# Patient Record
Sex: Female | Born: 1945 | Race: White | Hispanic: No | Marital: Married | Smoking: Former smoker
Health system: Southern US, Community
[De-identification: ages and names within clinical notes are randomized; demographics above are authoritative.]

## PROBLEM LIST (undated history)

## (undated) DIAGNOSIS — E785 Hyperlipidemia, unspecified: Secondary | ICD-10-CM

## (undated) DIAGNOSIS — M8580 Other specified disorders of bone density and structure, unspecified site: Secondary | ICD-10-CM

## (undated) DIAGNOSIS — I10 Essential (primary) hypertension: Secondary | ICD-10-CM

## (undated) DIAGNOSIS — M26609 Unspecified temporomandibular joint disorder, unspecified side: Secondary | ICD-10-CM

## (undated) HISTORY — PX: OVARIAN CYST REMOVAL: SHX89

## (undated) HISTORY — DX: Hyperlipidemia, unspecified: E78.5

## (undated) HISTORY — DX: Other specified disorders of bone density and structure, unspecified site: M85.80

## (undated) HISTORY — PX: ABDOMINAL HYSTERECTOMY: SHX81

## (undated) HISTORY — DX: Essential (primary) hypertension: I10

## (undated) HISTORY — PX: PILONIDAL CYST EXCISION: SHX744

## (undated) HISTORY — DX: Unspecified temporomandibular joint disorder, unspecified side: M26.609

---

## 1998-01-26 ENCOUNTER — Other Ambulatory Visit: Admission: RE | Admit: 1998-01-26 | Discharge: 1998-01-26 | Payer: Self-pay | Admitting: Obstetrics and Gynecology

## 1999-08-27 ENCOUNTER — Other Ambulatory Visit: Admission: RE | Admit: 1999-08-27 | Discharge: 1999-08-27 | Payer: Self-pay | Admitting: Obstetrics and Gynecology

## 2001-11-23 ENCOUNTER — Other Ambulatory Visit: Admission: RE | Admit: 2001-11-23 | Discharge: 2001-11-23 | Payer: Self-pay | Admitting: Obstetrics and Gynecology

## 2009-12-25 ENCOUNTER — Encounter: Payer: Self-pay | Admitting: Internal Medicine

## 2010-01-31 ENCOUNTER — Encounter (INDEPENDENT_AMBULATORY_CARE_PROVIDER_SITE_OTHER): Payer: Self-pay | Admitting: *Deleted

## 2010-02-01 ENCOUNTER — Ambulatory Visit: Payer: Self-pay | Admitting: Internal Medicine

## 2010-02-15 ENCOUNTER — Ambulatory Visit: Payer: Self-pay | Admitting: Internal Medicine

## 2010-06-26 NOTE — Miscellaneous (Signed)
Summary: [facility] previsit  Clinical Lists Changes  Medications: Added new medication of DULCOLAX 5 MG  TBEC (BISACODYL) Day before procedure take 2 at 3pm and 2 at 8pm. - Signed Added new medication of METOCLOPRAMIDE HCL 10 MG  TABS (METOCLOPRAMIDE HCL) As per prep instructions. - Signed Added new medication of MIRALAX   POWD (POLYETHYLENE GLYCOL 3350) As per prep  instructions. - Signed Rx of DULCOLAX 5 MG  TBEC (BISACODYL) Day before procedure take 2 at 3pm and 2 at 8pm.;  #4 x 0;  Signed;  Entered by: Karl Bales RN;  Authorized by: Hart Carwin MD;  Method used: Electronically to [facility] [address] #[id number]*, [address] [place], [place]  [address], Ph: [phone], Fax: [phone] Rx of METOCLOPRAMIDE HCL 10 MG  TABS (METOCLOPRAMIDE HCL) As per prep instructions.;  #2 x 0;  Signed;  Entered by: Karl Bales RN;  Authorized by: Hart Carwin MD;  Method used: Electronically to [facility] [address] #[id number]*, [address] [place], [place]  [address], Ph: [phone], Fax: [phone] Rx of MIRALAX   POWD (POLYETHYLENE GLYCOL 3350) As per prep  instructions.;  #255gm x 0;  Signed;  Entered by: Karl Bales RN;  Authorized by: Hart Carwin MD;  Method used: Electronically to [facility] [address] #[id number]*, [address] [place], [place]  [address], Ph: [phone], Fax: [phone] Allergies: Added new allergy or adverse reaction of MACRODANTIN Added new allergy or adverse reaction of AMPICILLIN Added new allergy or adverse reaction of * MINOCIN Added new allergy or adverse reaction of DARVOCET Added new allergy or adverse reaction of * DARVON Added new allergy or adverse reaction of CODEINE Added new allergy or adverse reaction of * OXYCONTIN Added new allergy or adverse reaction of HYDROCODONE Observations: Added new observation of NKA: F (02/01/2010 7:55)    Prescriptions: MIRALAX   POWD (POLYETHYLENE GLYCOL 3350) As per prep  instructions.  #255gm x 0   Entered by:    Karl Bales RN   Authorized by:   Hart Carwin MD   Signed by:   Karl Bales RN on 02/01/2010   Method used:   Electronically to        [facility] [address] #[id number]* (retail)       [address] [place], [place]  [address]       Ph: [phone]       Fax: [phone]   RxID:   [id number] METOCLOPRAMIDE HCL 10 MG  TABS (METOCLOPRAMIDE HCL) As per prep instructions.  #2 x 0   Entered by:   Karl Bales RN   Authorized by:   Hart Carwin MD   Signed by:   Karl Bales RN on 02/01/2010   Method used:   Electronically to        [facility] [address] #[id number]* (retail)       [address] [place], [place]  [address]       Ph: [phone]       Fax: [phone]   RxID:   [id number] DULCOLAX 5 MG  TBEC (BISACODYL) Day before procedure take 2 at 3pm and 2 at 8pm.  #4 x 0   Entered by:   Karl Bales RN   Authorized by:   Hart Carwin MD   Signed by:   Karl Bales RN on 02/01/2010   Method used:   Electronically to  [facility] [address][id number]* (retail)       [address] [place], [place]  [address]       Ph: [phone]       Fax: [phone]   RxID:   [id number]

## 2010-06-26 NOTE — Letter (Signed)
Summary: [facility] Instructions  [facility]  [address] [address] [place], [place] [address]   Phone: [phone]  Fax: [phone]       Cheryl Skinner    August 31, 1945    MRN: [id number]       Procedure Day Dorna Bloom:  Lenor Coffin  02/15/10     Arrival Time:   8:00AM     Procedure Time:  9:00AM     Location of Procedure:                    _ X_   Endoscopy Center (4th Floor)   PREPARATION FOR COLONOSCOPY WITH MIRALAX  Starting 5 days prior to your procedure 02/10/10 do not eat nuts, seeds, popcorn, corn, beans, peas,  salads, or any raw vegetables.  Do not take any fiber supplements (e.g. Metamucil, Citrucel, and Benefiber). ____________________________________________________________________________________________________   THE DAY BEFORE YOUR PROCEDURE         DATE: 02/14/10  DAY: WEDNESDAY  1   Drink clear liquids the entire day-NO SOLID FOOD  2   Do not drink anything colored red or purple.  Avoid juices with pulp.  No orange juice.  3   Drink at least 64 oz. (8 glasses) of fluid/clear liquids during the day to prevent dehydration and help the prep work efficiently.  CLEAR LIQUIDS INCLUDE: Water Jello Ice Popsicles Tea (sugar ok, no milk/cream) Powdered fruit flavored drinks Coffee (sugar ok, no milk/cream) Gatorade Juice: apple, white grape, white cranberry  Lemonade Clear bullion, consomm, broth Carbonated beverages (any kind) Strained chicken noodle soup Hard Candy  4   Mix the entire bottle of Miralax with 64 oz. of Gatorade/Powerade in the morning and put in the refrigerator to chill.  5   At 3:00 pm take 2 Dulcolax/Bisacodyl tablets.  6   At 4:30 pm take one Reglan/Metoclopramide tablet.  7  Starting at 5:00 pm drink one 8 oz glass of the Miralax mixture every 15-20 minutes until you have finished drinking the entire 64 oz.  You should finish drinking prep around 7:30 or 8:00 pm.  8   If you are nauseated, you may take the 2nd Reglan/Metoclopramide  tablet at 6:30 pm.        9    At 8:00 pm take 2 more DULCOLAX/Bisacodyl tablets.     THE DAY OF YOUR PROCEDURE      DATE:  02/15/10  DAY: Lenor Coffin  You may drink clear liquids until 7:00AM  (2 HOURS BEFORE PROCEDURE).   MEDICATION INSTRUCTIONS  Unless otherwise instructed, you should take regular prescription medications with a small sip of water as early as possible the morning of your procedure.   Additional medication instructions: HOLD Losartan/HCTZ before coming in the morning of your procedure (hold 02-15-10 only)         OTHER INSTRUCTIONS  You will need a responsible adult at least 67 years of age to accompany you and drive you home.   This person must remain in the waiting room during your procedure.  Wear loose fitting clothing that is easily removed.  Leave jewelry and other valuables at home.  However, you may wish to bring a book to read or an iPod/MP3 player to listen to music as you wait for your procedure to start.  Remove all body piercing jewelry and leave at home.  Total time from sign-in until discharge is approximately 2-3 hours.  You should go home directly after your procedure and rest.  You can resume normal  activities the day after your procedure.  The day of your procedure you should not:   Drive   Make legal decisions   Operate machinery   Drink alcohol   Return to work  You will receive specific instructions about eating, activities and medications before you leave.   The above instructions have been reviewed and explained to me by   Karl Bales RN  February 01, 2010 8:29 AM    I fully understand and can verbalize these instructions _____________________________ Date _______

## 2010-06-26 NOTE — Procedures (Signed)
Summary: Colonoscopy  Patient: Cheryl Skinner Note: All result statuses are Final unless otherwise noted.  Tests: (1) Colonoscopy (COL)   COL Colonoscopy           DONE     [facility]     [address]     [place], [place]  [address]           COLONOSCOPY PROCEDURE REPORT           PATIENT:  Cheryl, Skinner  MR#:  [id number]     BIRTHDATE:  1946/04/27, 63 yrs. old  GENDER:  female     ENDOSCOPIST:  Hedwig Morton. Juanda Chance, MD     REF. BY:  Frazier Richards, M.D.     PROCEDURE DATE:  02/15/2010     PROCEDURE:  Colonoscopy 19147     ASA CLASS:  Class I     INDICATIONS:  Routine Risk Screening     MEDICATIONS:   Versed 8 mg, Fentanyl 75 mcg           DESCRIPTION OF PROCEDURE:   After the risks benefits and     alternatives of the procedure were thoroughly explained, informed     consent was obtained.  Digital rectal exam was performed and     revealed no rectal masses.   The [id number] endoscope was     introduced through the anus and advanced to the cecum, which was     identified by both the appendix and ileocecal valve, without     limitations.  The quality of the prep was good, using MiraLax.     The instrument was then slowly withdrawn as the colon was fully     examined.     <<PROCEDUREIMAGES>>           FINDINGS:  No polyps or cancers were seen (see image1, image3, and     image4).   Retroflexed views in the rectum revealed no     abnormalities.    The scope was then withdrawn from the patient     and the procedure completed.           COMPLICATIONS:  None     ENDOSCOPIC IMPRESSION:     1) No polyps or cancers     2) Normal colonoscopy     RECOMMENDATIONS:     1) high fiber diet     REPEAT EXAM:  In 10 year(s) for.           ______________________________     Hedwig Morton. Juanda Chance, MD           CC:           n.     eSIGNED:   Hedwig Morton. Brodie at 02/15/2010 09:30 AM           Phoebe Sharps, [id number]  Note: An exclamation mark (!) indicates a result that was not  dispersed into the flowsheet. Document Creation Date: 02/15/2010 9:30 AM _______________________________________________________________________  (1) Order result status: Final Collection or observation date-time: 02/15/2010 09:24 Requested date-time:  Receipt date-time:  Reported date-time:  Referring Physician:   Ordering Physician: Lina Sar [id number]) Specimen Source:  Source: Launa Grill Order Number: [id number] Lab site:   Appended Document: Colonoscopy    Clinical Lists Changes  Observations: Added new observation of COLONNXTDUE: 01/2020 (02/15/2010 14:10)

## 2010-06-26 NOTE — Letter (Signed)
Summary: Previsit letter  [facility]  [address] [place], [place] [address]   Phone: [phone]  Fax: [phone]       12/25/2009 MRN: [id number]  ANGELIE KRAM [address] [place]  [address]  Dear Ms. Broadus John,  Welcome to the Gastroenterology Division at [facility].    You are scheduled to see a nurse for your pre-procedure visit on 02-01-10 at 8am on the 3rd floor at [facility], [address]  We ask that you try to arrive at our office 15 minutes prior to your appointment time to allow for check-in.  Your nurse visit will consist of discussing your medical and surgical history, your immediate family medical history, and your medications.    Please bring a complete list of all your medications or, if you prefer, bring the medication bottles and we will list them.  We will need to be aware of both prescribed and over the counter drugs.  We will need to know exact dosage information as well.  If you are on blood thinners (Coumadin, Plavix, Aggrenox, Ticlid, etc.) please call our office today/prior to your appointment, as we need to consult with your physician about holding your medication.   Please be prepared to read and sign documents such as consent forms, a financial agreement, and acknowledgement forms.  If necessary, and with your consent, a friend or relative is welcome to sit-in on the nurse visit with you.  Please bring your insurance card so that we may make a copy of it.  If your insurance requires a referral to see a specialist, please bring your referral form from your primary care physician.  No co-pay is required for this nurse visit.     If you cannot keep your appointment, please call [phone] to cancel or reschedule prior to your appointment date.  This allows [place] the opportunity to schedule an appointment for another patient in need of care.    Thank you for choosing [facility] for your medical needs.  We  appreciate the opportunity to care for you.  Please visit [place] at our website  to learn more about our practice.                     Sincerely.                                                                                                                   The Gastroenterology Division

## 2012-08-12 DIAGNOSIS — M25539 Pain in unspecified wrist: Secondary | ICD-10-CM | POA: Diagnosis not present

## 2012-08-12 DIAGNOSIS — G5600 Carpal tunnel syndrome, unspecified upper limb: Secondary | ICD-10-CM | POA: Diagnosis not present

## 2012-08-24 ENCOUNTER — Other Ambulatory Visit: Payer: Self-pay | Admitting: Physician Assistant

## 2012-08-24 DIAGNOSIS — I10 Essential (primary) hypertension: Secondary | ICD-10-CM

## 2012-08-25 NOTE — Telephone Encounter (Signed)
Medication refilled per protocol. 

## 2012-12-02 ENCOUNTER — Other Ambulatory Visit: Payer: Self-pay | Admitting: Family Medicine

## 2013-01-28 DIAGNOSIS — L819 Disorder of pigmentation, unspecified: Secondary | ICD-10-CM | POA: Diagnosis not present

## 2013-01-28 DIAGNOSIS — D1801 Hemangioma of skin and subcutaneous tissue: Secondary | ICD-10-CM | POA: Diagnosis not present

## 2013-01-28 DIAGNOSIS — L821 Other seborrheic keratosis: Secondary | ICD-10-CM | POA: Diagnosis not present

## 2013-01-28 DIAGNOSIS — D239 Other benign neoplasm of skin, unspecified: Secondary | ICD-10-CM | POA: Diagnosis not present

## 2013-01-28 DIAGNOSIS — D485 Neoplasm of uncertain behavior of skin: Secondary | ICD-10-CM | POA: Diagnosis not present

## 2013-01-29 DIAGNOSIS — B079 Viral wart, unspecified: Secondary | ICD-10-CM | POA: Diagnosis not present

## 2013-02-09 ENCOUNTER — Other Ambulatory Visit: Payer: Self-pay | Admitting: Family Medicine

## 2013-02-09 ENCOUNTER — Encounter: Payer: Self-pay | Admitting: Family Medicine

## 2013-02-09 NOTE — Telephone Encounter (Signed)
Medication refill for one time only.  Patient needs to be seen.  Letter sent for patient to call and schedule 

## 2013-03-02 ENCOUNTER — Ambulatory Visit (INDEPENDENT_AMBULATORY_CARE_PROVIDER_SITE_OTHER): Payer: Medicare Other | Admitting: Family Medicine

## 2013-03-02 ENCOUNTER — Encounter: Payer: Self-pay | Admitting: Family Medicine

## 2013-03-02 VITALS — BP 122/70 | HR 80 | Temp 98.2°F | Resp 16 | Ht 61.5 in | Wt 148.0 lb

## 2013-03-02 DIAGNOSIS — M26609 Unspecified temporomandibular joint disorder, unspecified side: Secondary | ICD-10-CM | POA: Insufficient documentation

## 2013-03-02 DIAGNOSIS — E785 Hyperlipidemia, unspecified: Secondary | ICD-10-CM | POA: Insufficient documentation

## 2013-03-02 DIAGNOSIS — I10 Essential (primary) hypertension: Secondary | ICD-10-CM | POA: Diagnosis not present

## 2013-03-02 DIAGNOSIS — Z1231 Encounter for screening mammogram for malignant neoplasm of breast: Secondary | ICD-10-CM | POA: Diagnosis not present

## 2013-03-02 MED ORDER — LOSARTAN POTASSIUM-HCTZ 100-25 MG PO TABS: ORAL_TABLET | ORAL | Status: DC

## 2013-03-02 MED ORDER — MECLIZINE HCL 25 MG PO TABS: 25.0000 mg | ORAL_TABLET | Freq: Three times a day (TID) | ORAL | Status: DC | PRN

## 2013-03-02 MED ORDER — ATORVASTATIN CALCIUM 40 MG PO TABS: ORAL_TABLET | ORAL | Status: DC

## 2013-03-02 NOTE — Progress Notes (Signed)
  Subjective:    Patient ID: Cheryl Skinner, female    DOB: [birth year], 67 y.o.   MRN: [id number]  HPI  Patient history of hypertension. She's coming on Hyzaar 100/25 one by mouth daily. Her blood pressures well controlled 122/70. She denies any chest pain, shortness of breath, dyspnea on exertion patient is also taking Lipitor 40 mg by mouth daily for hyperlipidemia patient denies any myalgias or right quadrant pain.  She is due for labs.  Her mammogram is up to date. She does not require Pap smear. Colonoscopy is up to date. She is due for a flu shot. She had a pneumonia vaccine in 2012 and then is up to date. No past medical history on file. No current outpatient prescriptions on file prior to visit.   No current facility-administered medications on file prior to visit.   Allergies  Allergen Reactions  . Ampicillin     REACTION: makes sick  . Codeine     REACTION: loses memory  . Hydrocodone     REACTION: makes sick  . Minocycline Hcl     REACTION: makes sick  . Nitrofurantoin     REACTION: makes sick  . Oxycodone Hcl     REACTION: loses memory  . Propoxyphene Hcl     REACTION: makes sick  . Propoxyphene-Acetaminophen     REACTION: makes sick   History   Social History  . Marital Status: Married    Spouse Name: N/A    Number of Children: N/A  . Years of Education: N/A   Occupational History  . Not on file.   Social History Main Topics  . Smoking status: Former Smoker    Types: Cigarettes  . Smokeless tobacco: Not on file  . Alcohol Use: Yes     Comment: Rare  . Drug Use: No  . Sexual Activity: Not on file   Other Topics Concern  . Not on file   Social History Narrative  . No narrative on file     Review of Systems  All other systems reviewed and are negative.       Objective:   Physical Exam  Vitals reviewed. Neck: Neck supple. No JVD present. No thyromegaly present.  Cardiovascular: Normal rate, regular rhythm and normal heart sounds.  Exam  reveals no gallop and no friction rub.   No murmur heard. Pulmonary/Chest: Effort normal and breath sounds normal. No respiratory distress. She has no wheezes. She has no rales. She exhibits no tenderness.  Abdominal: Soft. Bowel sounds are normal. She exhibits no distension. There is no tenderness. There is no rebound and no guarding.  Lymphadenopathy:    She has no cervical adenopathy.          Assessment & Plan:  1. HTN (hypertension) Pressures well controlled today. Continue current medications at the present dosages. - COMPLETE METABOLIC PANEL WITH GFR; Future - Lipid panel; Future  2. HLD (hyperlipidemia) Check fasting lipid panel. Goal LDL is less than 130.  I did update the patient's flu shot today. - COMPLETE METABOLIC PANEL WITH GFR; Future - Lipid panel; Future

## 2013-03-04 ENCOUNTER — Other Ambulatory Visit: Payer: Medicare Other

## 2013-03-04 DIAGNOSIS — E785 Hyperlipidemia, unspecified: Secondary | ICD-10-CM

## 2013-03-04 DIAGNOSIS — I10 Essential (primary) hypertension: Secondary | ICD-10-CM

## 2013-03-04 DIAGNOSIS — R928 Other abnormal and inconclusive findings on diagnostic imaging of breast: Secondary | ICD-10-CM | POA: Diagnosis not present

## 2013-03-04 DIAGNOSIS — Z803 Family history of malignant neoplasm of breast: Secondary | ICD-10-CM | POA: Diagnosis not present

## 2013-03-04 DIAGNOSIS — N6489 Other specified disorders of breast: Secondary | ICD-10-CM | POA: Diagnosis not present

## 2013-03-04 LAB — COMPLETE METABOLIC PANEL WITH GFR
ALT: 30 U/L (ref 0–35)
AST: 22 U/L (ref 0–37)
Alkaline Phosphatase: 55 U/L (ref 39–117)
BUN: 17 mg/dL (ref 6–23)
Calcium: 9.3 mg/dL (ref 8.4–10.5)
Chloride: 104 mEq/L (ref 96–112)
Creat: 0.58 mg/dL (ref 0.50–1.10)
Total Bilirubin: 0.6 mg/dL (ref 0.3–1.2)

## 2013-03-04 LAB — LIPID PANEL
Cholesterol: 173 mg/dL (ref 0–200)
HDL: 46 mg/dL (ref >39)
LDL Cholesterol: 98 mg/dL (ref 0–99)
Triglycerides: 145 mg/dL (ref <150)
VLDL: 29 mg/dL (ref 0–40)

## 2013-03-10 ENCOUNTER — Encounter: Payer: Self-pay | Admitting: Family Medicine

## 2013-05-14 ENCOUNTER — Encounter: Payer: Self-pay | Admitting: Family Medicine

## 2013-06-03 ENCOUNTER — Telehealth: Payer: Self-pay | Admitting: Family Medicine

## 2013-06-03 NOTE — Telephone Encounter (Signed)
PT wants to know if she has received her flu shot or not  Call back number is [phone]

## 2013-06-04 NOTE — Telephone Encounter (Signed)
Left pt mess that flu shot not on record in our records.

## 2013-09-02 DIAGNOSIS — R928 Other abnormal and inconclusive findings on diagnostic imaging of breast: Secondary | ICD-10-CM | POA: Diagnosis not present

## 2013-10-04 ENCOUNTER — Encounter: Payer: Self-pay | Admitting: Family Medicine

## 2013-12-27 ENCOUNTER — Telehealth: Payer: Self-pay | Admitting: Family Medicine

## 2013-12-30 ENCOUNTER — Ambulatory Visit (INDEPENDENT_AMBULATORY_CARE_PROVIDER_SITE_OTHER): Payer: Medicare Other | Admitting: Family Medicine

## 2013-12-30 ENCOUNTER — Encounter: Payer: Self-pay | Admitting: Family Medicine

## 2013-12-30 VITALS — BP 142/78 | HR 86 | Temp 98.2°F | Resp 16 | Ht 61.5 in | Wt 148.0 lb

## 2013-12-30 DIAGNOSIS — E785 Hyperlipidemia, unspecified: Secondary | ICD-10-CM | POA: Diagnosis not present

## 2013-12-30 DIAGNOSIS — I10 Essential (primary) hypertension: Secondary | ICD-10-CM

## 2013-12-30 MED ORDER — MECLIZINE HCL 25 MG PO TABS: 25.0000 mg | ORAL_TABLET | Freq: Three times a day (TID) | ORAL | Status: DC | PRN

## 2013-12-30 NOTE — Progress Notes (Signed)
Subjective:    Patient ID: Cheryl Skinner, female    DOB: [birth year], 68 y.o.   MRN: [id number]  HPI  Patient has been having episodic vertigo recently. She describes vertigo with position changes. She denies any hearing loss or tinnitus. She also has a history of hypertension and hyperlipidemia. Her blood pressures mildly elevated at 142/78. She denies any chest pain shortness of breath or dyspnea on exertion. She is overdue for a CMP as well as a fasting lipid panel. Past Medical History  Diagnosis Date  . Hyperlipidemia   . Hypertension   . TMJ (temporomandibular joint syndrome)    Current Outpatient Prescriptions on File Prior to Visit  Medication Sig Dispense Refill  . atorvastatin (LIPITOR) 40 MG tablet TAKE ONE TABLET BY MOUTH ONCE DAILY AT BEDTIME  30 tablet  11  . cyclobenzaprine (FLEXERIL) 10 MG tablet Take 10 mg by mouth 3 (three) times daily as needed (TMJ).      . diclofenac (VOLTAREN) 75 MG EC tablet Take 75 mg by mouth 2 (two) times daily. PRN TMJ      . losartan-hydrochlorothiazide (HYZAAR) 100-25 MG per tablet TAKE ONE TABLET BY MOUTH ONCE DAILY  30 tablet  11   No current facility-administered medications on file prior to visit.   Allergies  Allergen Reactions  . Ampicillin     REACTION: makes sick  . Codeine     REACTION: loses memory  . Hydrocodone     REACTION: makes sick  . Minocycline Hcl     REACTION: makes sick  . Nitrofurantoin     REACTION: makes sick  . Oxycodone Hcl     REACTION: loses memory  . Propoxyphene Hcl     REACTION: makes sick  . Propoxyphene N-Acetaminophen     REACTION: makes sick   History   Social History  . Marital Status: Married    Spouse Name: N/A    Number of Children: N/A  . Years of Education: N/A   Occupational History  . Not on file.   Social History Main Topics  . Smoking status: Former Smoker    Types: Cigarettes  . Smokeless tobacco: Not on file  . Alcohol Use: Yes     Comment: Rare  . Drug Use: No  .  Sexual Activity: Not on file   Other Topics Concern  . Not on file   Social History Narrative  . No narrative on file     Review of Systems  All other systems reviewed and are negative.      Objective:   Physical Exam  Vitals reviewed. Constitutional: She is oriented to person, place, and time. She appears well-developed and well-nourished. No distress.  HENT:  Right Ear: External ear normal.  Left Ear: External ear normal.  Nose: Nose normal.  Mouth/Throat: Oropharynx is clear and moist. No oropharyngeal exudate.  Eyes: Conjunctivae are normal. Pupils are equal, round, and reactive to light.  Neck: Neck supple. No JVD present. No thyromegaly present.  Cardiovascular: Normal rate, normal heart sounds and intact distal pulses.   No murmur heard. Pulmonary/Chest: Effort normal and breath sounds normal. No respiratory distress. She has no wheezes. She has no rales.  Abdominal: Soft. Bowel sounds are normal. She exhibits no distension. There is no tenderness. There is no rebound.  Lymphadenopathy:    She has no cervical adenopathy.  Neurological: She is alert and oriented to person, place, and time. She has normal reflexes. She displays normal reflexes. No cranial nerve  deficit. She exhibits normal muscle tone. Coordination normal.  Skin: She is not diaphoretic.          Assessment & Plan:  1. Essential hypertension Blood pressure is borderline elevated. I recommended patient check her blood pressure frequently over the next 2 weeks and reports values. - COMPLETE METABOLIC PANEL WITH GFR - Lipid panel  2. HLD (hyperlipidemia) Return fasting for a CMP and fasting lipid panel. Goal LDL is less than 130. 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 - COMPLETE METABOLIC PANEL WITH GFR - Lipid panel

## 2014-01-25 ENCOUNTER — Encounter: Payer: Self-pay | Admitting: Internal Medicine

## 2014-03-11 ENCOUNTER — Other Ambulatory Visit: Payer: Self-pay | Admitting: Family Medicine

## 2014-03-15 ENCOUNTER — Other Ambulatory Visit: Payer: Self-pay | Admitting: Family Medicine

## 2014-04-06 DIAGNOSIS — B079 Viral wart, unspecified: Secondary | ICD-10-CM | POA: Diagnosis not present

## 2014-04-06 DIAGNOSIS — L814 Other melanin hyperpigmentation: Secondary | ICD-10-CM | POA: Diagnosis not present

## 2014-04-06 DIAGNOSIS — D239 Other benign neoplasm of skin, unspecified: Secondary | ICD-10-CM | POA: Diagnosis not present

## 2014-04-06 DIAGNOSIS — L821 Other seborrheic keratosis: Secondary | ICD-10-CM | POA: Diagnosis not present

## 2014-04-06 DIAGNOSIS — L820 Inflamed seborrheic keratosis: Secondary | ICD-10-CM | POA: Diagnosis not present

## 2014-04-06 DIAGNOSIS — L729 Follicular cyst of the skin and subcutaneous tissue, unspecified: Secondary | ICD-10-CM | POA: Diagnosis not present

## 2014-04-06 DIAGNOSIS — D1801 Hemangioma of skin and subcutaneous tissue: Secondary | ICD-10-CM | POA: Diagnosis not present

## 2014-04-06 DIAGNOSIS — L723 Sebaceous cyst: Secondary | ICD-10-CM | POA: Diagnosis not present

## 2014-06-14 DIAGNOSIS — Z1231 Encounter for screening mammogram for malignant neoplasm of breast: Secondary | ICD-10-CM | POA: Diagnosis not present

## 2014-06-14 DIAGNOSIS — Z803 Family history of malignant neoplasm of breast: Secondary | ICD-10-CM | POA: Diagnosis not present

## 2014-07-05 ENCOUNTER — Encounter: Payer: Self-pay | Admitting: Family Medicine

## 2014-08-26 ENCOUNTER — Ambulatory Visit (INDEPENDENT_AMBULATORY_CARE_PROVIDER_SITE_OTHER): Payer: Medicare Other | Admitting: Family Medicine

## 2014-08-26 ENCOUNTER — Encounter: Payer: Self-pay | Admitting: Family Medicine

## 2014-08-26 VITALS — BP 120/62 | HR 100 | Temp 98.5°F | Resp 18 | Ht 61.5 in | Wt 149.0 lb

## 2014-08-26 DIAGNOSIS — J208 Acute bronchitis due to other specified organisms: Secondary | ICD-10-CM | POA: Diagnosis not present

## 2014-08-26 MED ORDER — BENZONATATE 100 MG PO CAPS: 200.0000 mg | ORAL_CAPSULE | Freq: Three times a day (TID) | ORAL | Status: DC | PRN

## 2014-08-26 MED ORDER — AZITHROMYCIN 250 MG PO TABS: ORAL_TABLET | ORAL | Status: DC

## 2014-08-26 NOTE — Progress Notes (Signed)
Subjective:    Patient ID: Cheryl Skinner, female    DOB: 1945/06/03, 69 y.o.   MRN: [id number]  HPI Patient is a very pleasant 69 year old white female who presents with one-week history of head congestion, sinus congestion, postnasal drip, and persistent cough. She also has some bilateral pleurisy with coughing. She denies any hemoptysis. She denies any shortness of breath. She denies any fever or chills. Past Medical History  Diagnosis Date  . Hyperlipidemia   . Hypertension   . TMJ (temporomandibular joint syndrome)    Past Surgical History  Procedure Laterality Date  . Abdominal hysterectomy     Current Outpatient Prescriptions on File Prior to Visit  Medication Sig Dispense Refill  . atorvastatin (LIPITOR) 40 MG tablet Take 1 tablet (40 mg total) by mouth at bedtime. 30 tablet 5  . cyclobenzaprine (FLEXERIL) 10 MG tablet Take 10 mg by mouth 3 (three) times daily as needed (TMJ).    . diclofenac (VOLTAREN) 75 MG EC tablet Take 75 mg by mouth 2 (two) times daily. PRN TMJ    . losartan-hydrochlorothiazide (HYZAAR) 100-25 MG per tablet TAKE ONE TABLET BY MOUTH ONCE DAILY 30 tablet 5  . meclizine (ANTIVERT) 25 MG tablet Take 1 tablet (25 mg total) by mouth 3 (three) times daily as needed for dizziness. 30 tablet 1   No current facility-administered medications on file prior to visit.   Allergies  Allergen Reactions  . Ampicillin     REACTION: makes sick  . Codeine     REACTION: loses memory  . Hydrocodone     REACTION: makes sick  . Minocycline Hcl     REACTION: makes sick  . Nitrofurantoin     REACTION: makes sick  . Oxycodone Hcl     REACTION: loses memory  . Propoxyphene Hcl     REACTION: makes sick  . Propoxyphene N-Acetaminophen     REACTION: makes sick   History   Social History  . Marital Status: Married    Spouse Name: N/A  . Number of Children: N/A  . Years of Education: N/A   Occupational History  . Not on file.   Social History Main Topics  .  Smoking status: Former Smoker    Types: Cigarettes  . Smokeless tobacco: Not on file  . Alcohol Use: Yes     Comment: Rare  . Drug Use: No  . Sexual Activity: Not on file   Other Topics Concern  . Not on file   Social History Narrative      Review of Systems  All other systems reviewed and are negative.      Objective:   Physical Exam  Constitutional: She appears well-developed and well-nourished.  HENT:  Right Ear: External ear normal.  Left Ear: External ear normal.  Nose: Nose normal.  Mouth/Throat: Oropharynx is clear and moist.  Neck: Neck supple.  Cardiovascular: Normal rate, regular rhythm and normal heart sounds.   No murmur heard. Pulmonary/Chest: Effort normal and breath sounds normal. No respiratory distress. She has no wheezes. She has no rales. She exhibits no tenderness.  Abdominal: Soft. Bowel sounds are normal.  Lymphadenopathy:    She has no cervical adenopathy.  Vitals reviewed.         Assessment & Plan:  Acute bronchitis due to other specified organisms - Plan: benzonatate (TESSALON) 100 MG capsule, azithromycin (ZITHROMAX) 250 MG tablet  I believe the patient has viral bronchitis. I recommended tincture of time. She can use Mucinex DM as  needed for cough. She can use Sudafed as needed for congestion. She can also use Tessalon Perles 2 pills every 8 hours as needed for severe cough. If her symptoms deteriorate over the weekend I did give her a Z-Pak in case this is bacterial bronchitis by asked her not to fill the prescription unless symptoms drastically worsened

## 2014-09-19 ENCOUNTER — Other Ambulatory Visit: Payer: Self-pay | Admitting: Family Medicine

## 2014-09-20 ENCOUNTER — Encounter: Payer: Self-pay | Admitting: Family Medicine

## 2014-09-20 NOTE — Telephone Encounter (Signed)
Medication refill for one time only.  Patient needs to be seen.  Letter sent for patient to call and schedule 

## 2014-10-03 DIAGNOSIS — H2513 Age-related nuclear cataract, bilateral: Secondary | ICD-10-CM | POA: Diagnosis not present

## 2014-10-05 ENCOUNTER — Other Ambulatory Visit: Payer: Self-pay | Admitting: Family Medicine

## 2014-10-05 ENCOUNTER — Telehealth: Payer: Self-pay | Admitting: *Deleted

## 2014-10-05 MED ORDER — LOSARTAN POTASSIUM-HCTZ 100-25 MG PO TABS: 1.0000 | ORAL_TABLET | Freq: Every day | ORAL | Status: DC

## 2014-10-05 NOTE — Telephone Encounter (Signed)
Received call from patient.   Reports that she has CPE with MD scheduled for 10/11/2014, but will be out of Hyzaar.   Requested refill.   Medication filled x1 with no refills.

## 2014-10-11 ENCOUNTER — Ambulatory Visit (INDEPENDENT_AMBULATORY_CARE_PROVIDER_SITE_OTHER): Payer: Medicare Other | Admitting: Family Medicine

## 2014-10-11 ENCOUNTER — Encounter: Payer: Self-pay | Admitting: Family Medicine

## 2014-10-11 VITALS — BP 120/74 | HR 84 | Temp 98.1°F | Resp 16 | Ht 61.5 in | Wt 147.0 lb

## 2014-10-11 DIAGNOSIS — E785 Hyperlipidemia, unspecified: Secondary | ICD-10-CM | POA: Diagnosis not present

## 2014-10-11 DIAGNOSIS — Z23 Encounter for immunization: Secondary | ICD-10-CM

## 2014-10-11 DIAGNOSIS — I10 Essential (primary) hypertension: Secondary | ICD-10-CM | POA: Diagnosis not present

## 2014-10-11 LAB — CBC WITH DIFFERENTIAL/PLATELET
BASOS PCT: 1 % (ref 0–1)
Basophils Absolute: 0.1 10*3/uL (ref 0.0–0.1)
EOS ABS: 0.1 10*3/uL (ref 0.0–0.7)
Eosinophils Relative: 2 % (ref 0–5)
HCT: 40.1 % (ref 36.0–46.0)
HEMOGLOBIN: 13.5 g/dL (ref 12.0–15.0)
Lymphocytes Relative: 38 % (ref 12–46)
Lymphs Abs: 2.3 10*3/uL (ref 0.7–4.0)
MCH: 28.5 pg (ref 26.0–34.0)
MCHC: 33.7 g/dL (ref 30.0–36.0)
MCV: 84.6 fL (ref 78.0–100.0)
MONOS PCT: 9 % (ref 3–12)
MPV: 11 fL (ref 8.6–12.4)
Monocytes Absolute: 0.5 10*3/uL (ref 0.1–1.0)
NEUTROS PCT: 50 % (ref 43–77)
Neutro Abs: 3 10*3/uL (ref 1.7–7.7)
Platelets: 230 10*3/uL (ref 150–400)
RBC: 4.74 MIL/uL (ref 3.87–5.11)
RDW: 13.7 % (ref 11.5–15.5)
WBC: 6 10*3/uL (ref 4.0–10.5)

## 2014-10-11 NOTE — Progress Notes (Signed)
Subjective:    Patient ID: Cheryl Skinner, female    DOB: Jul 02, 1945, 69 y.o.   MRN: [id number]  HPI Patient is here today for follow-up of her hypertension and hyperlipidemia. Her blood pressure is excellent at 120/74. She denies any chest pain shortness of breath or dyspnea on exertion. She is having no complications from the medication. She denies any myalgias or right upper quadrant pain on the Lipitor. She is overdue for fasting lipid panel. Otherwise her mammogram, colonoscopy are up-to-date. She does not require a Pap smear. She is due for Prevnar 13 Past Medical History  Diagnosis Date  . Hyperlipidemia   . Hypertension   . TMJ (temporomandibular joint syndrome)    Past Surgical History  Procedure Laterality Date  . Abdominal hysterectomy     Current Outpatient Prescriptions on File Prior to Visit  Medication Sig Dispense Refill  . atorvastatin (LIPITOR) 40 MG tablet TAKE ONE TABLET BY MOUTH AT BEDTIME 30 tablet 0  . cyclobenzaprine (FLEXERIL) 10 MG tablet Take 10 mg by mouth 3 (three) times daily as needed (TMJ).    . diclofenac (VOLTAREN) 75 MG EC tablet Take 75 mg by mouth 2 (two) times daily. PRN TMJ    . losartan-hydrochlorothiazide (HYZAAR) 100-25 MG per tablet Take 1 tablet by mouth daily. 30 tablet 0  . losartan-hydrochlorothiazide (HYZAAR) 100-25 MG per tablet TAKE ONE TABLET BY MOUTH ONCE DAILY 30 tablet 0  . meclizine (ANTIVERT) 25 MG tablet Take 1 tablet (25 mg total) by mouth 3 (three) times daily as needed for dizziness. 30 tablet 1   No current facility-administered medications on file prior to visit.   Allergies  Allergen Reactions  . Ampicillin     REACTION: makes sick  . Codeine     REACTION: loses memory  . Hydrocodone     REACTION: makes sick  . Minocycline Hcl     REACTION: makes sick  . Nitrofurantoin     REACTION: makes sick  . Oxycodone Hcl     REACTION: loses memory  . Propoxyphene Hcl     REACTION: makes sick  . Propoxyphene  N-Acetaminophen     REACTION: makes sick   History   Social History  . Marital Status: Married    Spouse Name: N/A  . Number of Children: N/A  . Years of Education: N/A   Occupational History  . Not on file.   Social History Main Topics  . Smoking status: Former Smoker    Types: Cigarettes  . Smokeless tobacco: Not on file  . Alcohol Use: Yes     Comment: Rare  . Drug Use: No  . Sexual Activity: Not on file   Other Topics Concern  . Not on file   Social History Narrative     Review of Systems  All other systems reviewed and are negative.      Objective:   Physical Exam  Constitutional: She appears well-developed and well-nourished. No distress.  Neck: Neck supple. No JVD present. No thyromegaly present.  Cardiovascular: Normal rate, regular rhythm and normal heart sounds.   No murmur heard. Pulmonary/Chest: Effort normal and breath sounds normal. No respiratory distress. She has no wheezes. She has no rales.  Abdominal: Soft. Bowel sounds are normal. She exhibits no distension. There is no tenderness. There is no rebound and no guarding.  Musculoskeletal: She exhibits no edema.  Lymphadenopathy:    She has no cervical adenopathy.  Skin: She is not diaphoretic.  Vitals reviewed.  Assessment & Plan:  Benign essential HTN  HLD (hyperlipidemia)  Patient's blood pressure is excellent. I will make no changes in her blood pressure medication. I will check a fasting lipid panel. Goal LDL cholesterol is less than 130. Patient also received Prevnar 13. The remainder of her preventative care is up-to-date.

## 2014-10-12 LAB — COMPLETE METABOLIC PANEL WITH GFR
ALBUMIN: 3.8 g/dL (ref 3.5–5.2)
ALK PHOS: 52 U/L (ref 39–117)
ALT: 30 U/L (ref 0–35)
AST: 23 U/L (ref 0–37)
BUN: 15 mg/dL (ref 6–23)
CHLORIDE: 104 meq/L (ref 96–112)
CO2: 26 mEq/L (ref 19–32)
Calcium: 8.8 mg/dL (ref 8.4–10.5)
Creat: 0.64 mg/dL (ref 0.50–1.10)
GFR, Est African American: >89 mL/min
GFR, Est Non African American: >89 mL/min
Glucose, Bld: 81 mg/dL (ref 70–99)
POTASSIUM: 3.5 meq/L (ref 3.5–5.3)
Sodium: 142 mEq/L (ref 135–145)
TOTAL PROTEIN: 6.3 g/dL (ref 6.0–8.3)
Total Bilirubin: 0.7 mg/dL (ref 0.2–1.2)

## 2014-10-12 LAB — LIPID PANEL
Cholesterol: 149 mg/dL (ref 0–200)
HDL: 50 mg/dL (ref >=46)
LDL Cholesterol: 79 mg/dL (ref 0–99)
Total CHOL/HDL Ratio: 3 Ratio
Triglycerides: 102 mg/dL (ref <150)
VLDL: 20 mg/dL (ref 0–40)

## 2014-10-14 ENCOUNTER — Encounter: Payer: Self-pay | Admitting: Family Medicine

## 2014-10-19 ENCOUNTER — Other Ambulatory Visit: Payer: Self-pay | Admitting: Family Medicine

## 2014-12-17 ENCOUNTER — Other Ambulatory Visit: Payer: Self-pay | Admitting: Family Medicine

## 2014-12-19 NOTE — Telephone Encounter (Signed)
Medication refilled per protocol. 

## 2015-04-11 DIAGNOSIS — L820 Inflamed seborrheic keratosis: Secondary | ICD-10-CM | POA: Diagnosis not present

## 2015-04-11 DIAGNOSIS — L814 Other melanin hyperpigmentation: Secondary | ICD-10-CM | POA: Diagnosis not present

## 2015-04-11 DIAGNOSIS — D1801 Hemangioma of skin and subcutaneous tissue: Secondary | ICD-10-CM | POA: Diagnosis not present

## 2015-04-11 DIAGNOSIS — L720 Epidermal cyst: Secondary | ICD-10-CM | POA: Diagnosis not present

## 2015-04-11 DIAGNOSIS — L821 Other seborrheic keratosis: Secondary | ICD-10-CM | POA: Diagnosis not present

## 2015-04-11 DIAGNOSIS — L723 Sebaceous cyst: Secondary | ICD-10-CM | POA: Diagnosis not present

## 2015-04-11 DIAGNOSIS — D225 Melanocytic nevi of trunk: Secondary | ICD-10-CM | POA: Diagnosis not present

## 2015-04-22 ENCOUNTER — Other Ambulatory Visit: Payer: Self-pay | Admitting: Family Medicine

## 2015-07-04 DIAGNOSIS — Z1231 Encounter for screening mammogram for malignant neoplasm of breast: Secondary | ICD-10-CM | POA: Diagnosis not present

## 2015-07-11 ENCOUNTER — Telehealth: Payer: Self-pay | Admitting: Family Medicine

## 2015-07-11 NOTE — Telephone Encounter (Signed)
Pt is calling for a 90 day refill of Losartan. [facility] [phone]

## 2015-07-12 MED ORDER — LOSARTAN POTASSIUM-HCTZ 100-25 MG PO TABS: 1.0000 | ORAL_TABLET | Freq: Every day | ORAL | Status: DC

## 2015-07-12 NOTE — Telephone Encounter (Signed)
Medication called/sent to requested pharmacy  

## 2015-07-13 DIAGNOSIS — Z23 Encounter for immunization: Secondary | ICD-10-CM | POA: Diagnosis not present

## 2015-07-13 DIAGNOSIS — L820 Inflamed seborrheic keratosis: Secondary | ICD-10-CM | POA: Diagnosis not present

## 2015-08-01 ENCOUNTER — Encounter: Payer: Self-pay | Admitting: *Deleted

## 2015-10-16 ENCOUNTER — Ambulatory Visit (INDEPENDENT_AMBULATORY_CARE_PROVIDER_SITE_OTHER): Payer: Medicare Other | Admitting: Family Medicine

## 2015-10-16 ENCOUNTER — Encounter: Payer: Self-pay | Admitting: Family Medicine

## 2015-10-16 VITALS — BP 126/78 | HR 80 | Temp 97.6°F | Resp 18 | Ht 61.5 in | Wt 143.0 lb

## 2015-10-16 DIAGNOSIS — E785 Hyperlipidemia, unspecified: Secondary | ICD-10-CM | POA: Diagnosis not present

## 2015-10-16 DIAGNOSIS — I10 Essential (primary) hypertension: Secondary | ICD-10-CM

## 2015-10-16 LAB — COMPLETE METABOLIC PANEL WITH GFR
ALBUMIN: 4 g/dL (ref 3.6–5.1)
ALK PHOS: 51 U/L (ref 33–130)
ALT: 20 U/L (ref 6–29)
AST: 20 U/L (ref 10–35)
BUN: 11 mg/dL (ref 7–25)
CO2: 27 mmol/L (ref 20–31)
Calcium: 9.5 mg/dL (ref 8.6–10.4)
Chloride: 104 mmol/L (ref 98–110)
Creat: 0.73 mg/dL (ref 0.60–0.93)
GFR, EST NON AFRICAN AMERICAN: 83 mL/min (ref >=60)
Glucose, Bld: 92 mg/dL (ref 70–99)
Potassium: 3.9 mmol/L (ref 3.5–5.3)
Sodium: 142 mmol/L (ref 135–146)
Total Bilirubin: 0.6 mg/dL (ref 0.2–1.2)
Total Protein: 6.3 g/dL (ref 6.1–8.1)

## 2015-10-16 LAB — CBC WITH DIFFERENTIAL/PLATELET
Basophils Absolute: 60 cells/uL (ref 0–200)
Basophils Relative: 1 %
EOS ABS: 180 {cells}/uL (ref 15–500)
Eosinophils Relative: 3 %
HCT: 40.8 % (ref 35.0–45.0)
HEMOGLOBIN: 13.7 g/dL (ref 12.0–15.0)
Lymphocytes Relative: 36 %
Lymphs Abs: 2160 cells/uL (ref 850–3900)
MCH: 29 pg (ref 27.0–33.0)
MCHC: 33.6 g/dL (ref 32.0–36.0)
MCV: 86.4 fL (ref 80.0–100.0)
MPV: 11 fL (ref 7.5–12.5)
Monocytes Absolute: 480 cells/uL (ref 200–950)
Monocytes Relative: 8 %
NEUTROS ABS: 3120 {cells}/uL (ref 1500–7800)
NEUTROS PCT: 52 %
Platelets: 217 10*3/uL (ref 140–400)
RBC: 4.72 MIL/uL (ref 3.80–5.10)
RDW: 13.5 % (ref 11.0–15.0)
WBC: 6 10*3/uL (ref 3.8–10.8)

## 2015-10-16 LAB — LIPID PANEL
Cholesterol: 159 mg/dL (ref 125–200)
HDL: 54 mg/dL (ref >=46)
LDL Cholesterol: 82 mg/dL (ref <130)
TRIGLYCERIDES: 115 mg/dL (ref <150)
Total CHOL/HDL Ratio: 2.9 Ratio (ref <=5.0)
VLDL: 23 mg/dL (ref <30)

## 2015-10-16 NOTE — Progress Notes (Signed)
Subjective:    Patient ID: Cheryl Skinner, female    DOB: 01/16/1946, 70 y.o.   MRN: [id number]  HPI  Patient is here today for follow-up of her hypertension and hyperlipidemia. Her blood pressure is excellent at 126/78. She denies any chest pain shortness of breath or dyspnea on exertion. She is having no complications from the medication. She denies any myalgias or right upper quadrant pain on the Lipitor. She is overdue for fasting lipid panel. Patient is in the 80s demographic to check for hepatitis C. We discussed the risk factors for hepatitis C including sharing needles, IV drug use, blood transfusions prior to the 80s, working in healthcare. Patient denies any of those risk factors and therefore decides not to screen for hepatitis C. Past Medical History  Diagnosis Date  . Hyperlipidemia   . Hypertension   . TMJ (temporomandibular joint syndrome)    Past Surgical History  Procedure Laterality Date  . Abdominal hysterectomy     Current Outpatient Prescriptions on File Prior to Visit  Medication Sig Dispense Refill  . atorvastatin (LIPITOR) 40 MG tablet TAKE ONE TABLET BY MOUTH DAILY 30 tablet 5  . cyclobenzaprine (FLEXERIL) 10 MG tablet Take 10 mg by mouth 3 (three) times daily as needed (TMJ).    . diclofenac (VOLTAREN) 75 MG EC tablet Take 75 mg by mouth 2 (two) times daily. PRN TMJ    . losartan-hydrochlorothiazide (HYZAAR) 100-25 MG tablet Take 1 tablet by mouth daily. 90 tablet 0  . meclizine (ANTIVERT) 25 MG tablet Take 1 tablet (25 mg total) by mouth 3 (three) times daily as needed for dizziness. 30 tablet 1   No current facility-administered medications on file prior to visit.   Allergies  Allergen Reactions  . Ampicillin     REACTION: makes sick  . Codeine     REACTION: loses memory  . Hydrocodone     REACTION: makes sick  . Minocycline Hcl     REACTION: makes sick  . Nitrofurantoin     REACTION: makes sick  . Oxycodone Hcl     REACTION: loses memory  .  Propoxyphene Hcl     REACTION: makes sick  . Propoxyphene N-Acetaminophen     REACTION: makes sick   Social History   Social History  . Marital Status: Married    Spouse Name: N/A  . Number of Children: N/A  . Years of Education: N/A   Occupational History  . Not on file.   Social History Main Topics  . Smoking status: Former Smoker    Types: Cigarettes  . Smokeless tobacco: Not on file  . Alcohol Use: Yes     Comment: Rare  . Drug Use: No  . Sexual Activity: Not on file   Other Topics Concern  . Not on file   Social History Narrative     Review of Systems  All other systems reviewed and are negative.      Objective:   Physical Exam  Constitutional: She appears well-developed and well-nourished. No distress.  Neck: Neck supple. No JVD present. No thyromegaly present.  Cardiovascular: Normal rate, regular rhythm and normal heart sounds.   No murmur heard. Pulmonary/Chest: Effort normal and breath sounds normal. No respiratory distress. She has no wheezes. She has no rales.  Abdominal: Soft. Bowel sounds are normal. She exhibits no distension. There is no tenderness. There is no rebound and no guarding.  Musculoskeletal: She exhibits no edema.  Lymphadenopathy:    She has no  cervical adenopathy.  Skin: She is not diaphoretic.  Vitals reviewed.         Assessment & Plan:  Benign essential HTN - Plan: CBC with Differential/Platelet, COMPLETE METABOLIC PANEL WITH GFR, Lipid panel  HLD (hyperlipidemia)  Patient's blood pressure is excellent. I will make no changes in her blood pressure medication. I will check a fasting lipid panel. Goal LDL cholesterol is less than 130.  The remainder of her preventative care is up-to-date.

## 2015-10-18 ENCOUNTER — Encounter: Payer: Self-pay | Admitting: Family Medicine

## 2015-10-28 ENCOUNTER — Other Ambulatory Visit: Payer: Self-pay | Admitting: Family Medicine

## 2016-04-17 DIAGNOSIS — Z23 Encounter for immunization: Secondary | ICD-10-CM | POA: Diagnosis not present

## 2016-04-17 DIAGNOSIS — L821 Other seborrheic keratosis: Secondary | ICD-10-CM | POA: Diagnosis not present

## 2016-04-17 DIAGNOSIS — L814 Other melanin hyperpigmentation: Secondary | ICD-10-CM | POA: Diagnosis not present

## 2016-04-17 DIAGNOSIS — L820 Inflamed seborrheic keratosis: Secondary | ICD-10-CM | POA: Diagnosis not present

## 2016-04-17 DIAGNOSIS — D1801 Hemangioma of skin and subcutaneous tissue: Secondary | ICD-10-CM | POA: Diagnosis not present

## 2016-04-17 DIAGNOSIS — D225 Melanocytic nevi of trunk: Secondary | ICD-10-CM | POA: Diagnosis not present

## 2016-05-03 ENCOUNTER — Other Ambulatory Visit: Payer: Self-pay

## 2016-05-10 ENCOUNTER — Other Ambulatory Visit: Payer: Self-pay | Admitting: Family Medicine

## 2016-05-14 ENCOUNTER — Ambulatory Visit (INDEPENDENT_AMBULATORY_CARE_PROVIDER_SITE_OTHER): Payer: Medicare Other | Admitting: Family Medicine

## 2016-05-14 ENCOUNTER — Encounter: Payer: Self-pay | Admitting: Family Medicine

## 2016-05-14 VITALS — BP 130/80 | HR 86 | Temp 98.7°F | Resp 14 | Ht 61.5 in | Wt 148.0 lb

## 2016-05-14 DIAGNOSIS — E7800 Pure hypercholesterolemia, unspecified: Secondary | ICD-10-CM | POA: Diagnosis not present

## 2016-05-14 DIAGNOSIS — R05 Cough: Secondary | ICD-10-CM | POA: Diagnosis not present

## 2016-05-14 DIAGNOSIS — I10 Essential (primary) hypertension: Secondary | ICD-10-CM | POA: Diagnosis not present

## 2016-05-14 DIAGNOSIS — R053 Chronic cough: Secondary | ICD-10-CM

## 2016-05-14 MED ORDER — MECLIZINE HCL 25 MG PO TABS: 25.0000 mg | ORAL_TABLET | Freq: Three times a day (TID) | ORAL | 1 refills | Status: DC | PRN

## 2016-05-14 NOTE — Progress Notes (Signed)
Subjective:    Patient ID: Cheryl Skinner, female    DOB: 09-30-1945, 70 y.o.   MRN: [id number]  HPI Patient is here today for follow-up of her hypertension and hyperlipidemia. Her blood pressure is excellent. She denies any chest pain shortness of breath or dyspnea on exertion. She is having no complications from the medication. She denies any myalgias or right upper quadrant pain on the Lipitor. She is overdue for fasting lipid panel. However she does report a 3 month history of cough. The cough is nonproductive. It is worse at night when she lies down or first thing in the morning. She is constant having to clear her throat. She also reports isolated dysphagia to solids if she does not chew the food well. She also does have occasional acid reflux Past Medical History:  Diagnosis Date  . Hyperlipidemia   . Hypertension   . TMJ (temporomandibular joint syndrome)    Past Surgical History:  Procedure Laterality Date  . ABDOMINAL HYSTERECTOMY     Current Outpatient Prescriptions on File Prior to Visit  Medication Sig Dispense Refill  . atorvastatin (LIPITOR) 40 MG tablet TAKE ONE TABLET BY MOUTH ONCE DAILY 30 tablet 5  . cyclobenzaprine (FLEXERIL) 10 MG tablet Take 10 mg by mouth 3 (three) times daily as needed (TMJ).    . diclofenac (VOLTAREN) 75 MG EC tablet Take 75 mg by mouth 2 (two) times daily. PRN TMJ    . losartan-hydrochlorothiazide (HYZAAR) 100-25 MG tablet TAKE ONE TABLET BY MOUTH ONCE DAILY 90 tablet 1   No current facility-administered medications on file prior to visit.    Allergies  Allergen Reactions  . Ampicillin     REACTION: makes sick  . Codeine     REACTION: loses memory  . Hydrocodone     REACTION: makes sick  . Minocycline Hcl     REACTION: makes sick  . Nitrofurantoin     REACTION: makes sick  . Oxycodone Hcl     REACTION: loses memory  . Propoxyphene Hcl     REACTION: makes sick  . Propoxyphene N-Acetaminophen     REACTION: makes sick   Social  History   Social History  . Marital status: Married    Spouse name: N/A  . Number of children: N/A  . Years of education: N/A   Occupational History  . Not on file.   Social History Main Topics  . Smoking status: Former Smoker    Types: Cigarettes  . Smokeless tobacco: Not on file  . Alcohol use Yes     Comment: Rare  . Drug use: No  . Sexual activity: Not on file   Other Topics Concern  . Not on file   Social History Narrative  . No narrative on file     Review of Systems  All other systems reviewed and are negative.      Objective:   Physical Exam  Constitutional: She appears well-developed and well-nourished. No distress.  Neck: Neck supple. No JVD present. No thyromegaly present.  Cardiovascular: Normal rate, regular rhythm and normal heart sounds.   No murmur heard. Pulmonary/Chest: Effort normal and breath sounds normal. No respiratory distress. She has no wheezes. She has no rales.  Abdominal: Soft. Bowel sounds are normal. She exhibits no distension. There is no tenderness. There is no rebound and no guarding.  Musculoskeletal: She exhibits no edema.  Lymphadenopathy:    She has no cervical adenopathy.  Skin: She is not diaphoretic.  Vitals reviewed.  Assessment & Plan:  Chronic coughing - Plan: DG Chest 2 View  Benign essential HTN - Plan: COMPLETE METABOLIC PANEL WITH GFR, Lipid panel  Pure hypercholesterolemia  Patient's blood pressure is excellent. I will make no changes in her blood pressure medication. I will check a fasting lipid panel. Goal LDL cholesterol is less than 130.  The remainder of her preventative care is up-to-date.  I believe her chronic cough is likely due to acid reflux. I recommended a chest x-ray. If the chest x-ray is clear, I would recommend trying the patient on a proton pump inhibitor to see if her cough improves and if the dysphagia improves. If the dysphagia worsens, patient will need an EGD to evaluate for  stricture

## 2016-05-15 LAB — COMPLETE METABOLIC PANEL WITH GFR
ALBUMIN: 4 g/dL (ref 3.6–5.1)
ALK PHOS: 49 U/L (ref 33–130)
ALT: 20 U/L (ref 6–29)
AST: 20 U/L (ref 10–35)
BILIRUBIN TOTAL: 0.6 mg/dL (ref 0.2–1.2)
BUN: 13 mg/dL (ref 7–25)
CALCIUM: 8.9 mg/dL (ref 8.6–10.4)
CO2: 25 mmol/L (ref 20–31)
Chloride: 106 mmol/L (ref 98–110)
Creat: 0.7 mg/dL (ref 0.60–0.93)
GFR, Est African American: >89 mL/min (ref >=60)
GFR, Est Non African American: 87 mL/min (ref >=60)
Glucose, Bld: 83 mg/dL (ref 70–99)
POTASSIUM: 3.7 mmol/L (ref 3.5–5.3)
Sodium: 142 mmol/L (ref 135–146)
TOTAL PROTEIN: 6.5 g/dL (ref 6.1–8.1)

## 2016-05-15 LAB — LIPID PANEL
CHOLESTEROL: 192 mg/dL (ref <200)
HDL: 54 mg/dL (ref >50)
LDL Cholesterol: 113 mg/dL — ABNORMAL HIGH (ref <100)
Total CHOL/HDL Ratio: 3.6 Ratio (ref <5.0)
Triglycerides: 126 mg/dL (ref <150)
VLDL: 25 mg/dL (ref <30)

## 2016-05-25 ENCOUNTER — Other Ambulatory Visit: Payer: Self-pay | Admitting: Family Medicine

## 2016-05-28 ENCOUNTER — Ambulatory Visit
Admission: RE | Admit: 2016-05-28 | Discharge: 2016-05-28 | Disposition: A | Discharge status: Home / Self Care | Payer: Medicare Other | Source: Ambulatory Visit | Attending: Family Medicine | Admitting: Family Medicine

## 2016-05-28 DIAGNOSIS — R053 Chronic cough: Secondary | ICD-10-CM

## 2016-05-28 DIAGNOSIS — R05 Cough: Secondary | ICD-10-CM

## 2016-07-05 DIAGNOSIS — Z1231 Encounter for screening mammogram for malignant neoplasm of breast: Secondary | ICD-10-CM | POA: Diagnosis not present

## 2016-07-05 LAB — HM MAMMOGRAPHY

## 2016-07-10 ENCOUNTER — Other Ambulatory Visit: Payer: Self-pay | Admitting: Family Medicine

## 2016-07-10 ENCOUNTER — Encounter: Payer: Self-pay | Admitting: Family Medicine

## 2016-07-11 DIAGNOSIS — L309 Dermatitis, unspecified: Secondary | ICD-10-CM | POA: Diagnosis not present

## 2016-07-11 DIAGNOSIS — L821 Other seborrheic keratosis: Secondary | ICD-10-CM | POA: Diagnosis not present

## 2016-09-06 ENCOUNTER — Other Ambulatory Visit: Payer: Self-pay | Admitting: Family Medicine

## 2016-09-06 MED ORDER — DICLOFENAC SODIUM 75 MG PO TBEC: 75.0000 mg | DELAYED_RELEASE_TABLET | Freq: Two times a day (BID) | ORAL | 3 refills | Status: DC

## 2016-11-20 ENCOUNTER — Other Ambulatory Visit: Payer: Self-pay | Admitting: Family Medicine

## 2016-12-25 ENCOUNTER — Other Ambulatory Visit: Payer: Self-pay | Admitting: Family Medicine

## 2017-01-23 ENCOUNTER — Encounter: Payer: Self-pay | Admitting: Family Medicine

## 2017-01-23 ENCOUNTER — Ambulatory Visit (INDEPENDENT_AMBULATORY_CARE_PROVIDER_SITE_OTHER): Payer: Medicare Other | Admitting: Family Medicine

## 2017-01-23 VITALS — BP 128/78 | HR 84 | Temp 97.3°F | Resp 14 | Ht 61.5 in | Wt 145.0 lb

## 2017-01-23 DIAGNOSIS — M791 Myalgia, unspecified site: Secondary | ICD-10-CM

## 2017-01-23 DIAGNOSIS — M2550 Pain in unspecified joint: Secondary | ICD-10-CM | POA: Diagnosis not present

## 2017-01-23 LAB — CBC WITH DIFFERENTIAL/PLATELET
Basophils Absolute: 58 cells/uL (ref 0–200)
Basophils Relative: 1 %
EOS ABS: 116 {cells}/uL (ref 15–500)
EOS PCT: 2 %
HCT: 40.3 % (ref 35.0–45.0)
Hemoglobin: 13.4 g/dL (ref 12.0–15.0)
LYMPHS PCT: 38 %
Lymphs Abs: 2204 cells/uL (ref 850–3900)
MCH: 29.4 pg (ref 27.0–33.0)
MCHC: 33.3 g/dL (ref 32.0–36.0)
MCV: 88.4 fL (ref 80.0–100.0)
MONOS PCT: 11 %
MPV: 10.7 fL (ref 7.5–12.5)
Monocytes Absolute: 638 cells/uL (ref 200–950)
NEUTROS ABS: 2784 {cells}/uL (ref 1500–7800)
Neutrophils Relative %: 48 %
PLATELETS: 242 10*3/uL (ref 140–400)
RBC: 4.56 MIL/uL (ref 3.80–5.10)
RDW: 13.3 % (ref 11.0–15.0)
WBC: 5.8 10*3/uL (ref 3.8–10.8)

## 2017-01-23 NOTE — Progress Notes (Signed)
Subjective:    Patient ID: Cheryl Skinner, female    DOB: 1945-08-18, 71 y.o.   MRN: [id number]  HPI Patient was feeling fine until Saturday. Saturday, she developed sudden diffuse body aches all over her body. She states that her skin even hurt to touch. All of her muscles were sore. All of her joints were sore. This began suddenly without warning she denies any fever. She denies any tick bite. She denies any rash. She denies any symptoms of any infection other than the diffuse body aches. She denies any ear pain, sore throat, runny nose, nausea vomiting diarrhea or dysuria. Sunday she was feeling better in the morning and then Sunday afternoon into Sunday evening, the symptoms returned again. Monday morning she was feeling fine and again Monday evening the symptoms returned again with diffuse body aches and joint pains. Since Monday evening, the patient has been asymptomatic. She is here today for further evaluation Past Medical History:  Diagnosis Date  . Hyperlipidemia   . Hypertension   . TMJ (temporomandibular joint syndrome)    Past Surgical History:  Procedure Laterality Date  . ABDOMINAL HYSTERECTOMY     Current Outpatient Prescriptions on File Prior to Visit  Medication Sig Dispense Refill  . atorvastatin (LIPITOR) 40 MG tablet TAKE ONE TABLET BY MOUTH ONCE DAILY 30 tablet 5  . cyclobenzaprine (FLEXERIL) 10 MG tablet Take 10 mg by mouth 3 (three) times daily as needed (TMJ).    . diclofenac (VOLTAREN) 75 MG EC tablet Take 1 tablet (75 mg total) by mouth 2 (two) times daily. PRN TMJ 60 tablet 3  . losartan-hydrochlorothiazide (HYZAAR) 100-25 MG tablet TAKE ONE TABLET BY MOUTH ONCE DAILY 30 tablet 5  . meclizine (ANTIVERT) 25 MG tablet Take 1 tablet (25 mg total) by mouth 3 (three) times daily as needed for dizziness. 30 tablet 1   No current facility-administered medications on file prior to visit.    Allergies  Allergen Reactions  . Ampicillin     REACTION: makes sick  .  Codeine     REACTION: loses memory  . Hydrocodone     REACTION: makes sick  . Minocycline Hcl     REACTION: makes sick  . Nitrofurantoin     REACTION: makes sick  . Oxycodone Hcl     REACTION: loses memory  . Propoxyphene Hcl     REACTION: makes sick  . Propoxyphene N-Acetaminophen     REACTION: makes sick   Social History   Social History  . Marital status: Married    Spouse name: N/A  . Number of children: N/A  . Years of education: N/A   Occupational History  . Not on file.   Social History Main Topics  . Smoking status: Former Smoker    Types: Cigarettes  . Smokeless tobacco: Never Used  . Alcohol use Yes     Comment: Rare  . Drug use: No  . Sexual activity: Not on file   Other Topics Concern  . Not on file   Social History Narrative  . No narrative on file     Review of Systems  All other systems reviewed and are negative.      Objective:   Physical Exam  Constitutional: She appears well-developed and well-nourished. No distress.  Neck: Neck supple. No JVD present. No thyromegaly present.  Cardiovascular: Normal rate, regular rhythm and normal heart sounds.   No murmur heard. Pulmonary/Chest: Effort normal and breath sounds normal. No respiratory distress. She has  no wheezes. She has no rales.  Abdominal: Soft. Bowel sounds are normal. She exhibits no distension. There is no tenderness. There is no rebound and no guarding.  Musculoskeletal: She exhibits no edema.  Lymphadenopathy:    She has no cervical adenopathy.  Skin: She is not diaphoretic.  Vitals reviewed.         Assessment & Plan:  Myalgia - Plan: CBC with Differential/Platelet, COMPLETE METABOLIC PANEL WITH GFR, ANA, B. burgdorfi antibodies by WB, Sedimentation rate, CK  Polyarthralgia - Plan: CBC with Differential/Platelet, COMPLETE METABOLIC PANEL WITH GFR, ANA, B. burgdorfi antibodies by WB, Sedimentation rate, CK  The diurnal variation sounds more infectious similar to a fever  curve. This makes me suspicious the patient may have a viral infection. She's been asymptomatic now for 3 days making me think that the infection is passing. Hopefully this is the case. Other possible causes would be serious bacterial infections although this is unlikely without a source or any other symptoms, tickborne illness, autoimmune diseases, or statin-induced myopathy. I will check a sedimentation rate and an ANA to evaluate for autoimmune diseases, I'll check a CBC to evaluate for signs of a severe/serious bacterial illness although I believe this is unlikely as her exam is reassuring, I will check a CK level to look for statin-induced myopathy. Await the results of lab work. If symptoms return, I instructed the patient to discontinue Lipitor

## 2017-01-24 LAB — COMPLETE METABOLIC PANEL WITH GFR
ALT: 47 U/L — AB (ref 6–29)
AST: 31 U/L (ref 10–35)
Albumin: 4 g/dL (ref 3.6–5.1)
Alkaline Phosphatase: 59 U/L (ref 33–130)
BILIRUBIN TOTAL: 0.5 mg/dL (ref 0.2–1.2)
BUN: 19 mg/dL (ref 7–25)
CHLORIDE: 105 mmol/L (ref 98–110)
CO2: 24 mmol/L (ref 20–32)
CREATININE: 0.68 mg/dL (ref 0.60–0.93)
Calcium: 8.9 mg/dL (ref 8.6–10.4)
GFR, EST NON AFRICAN AMERICAN: 88 mL/min (ref >=60)
GLUCOSE: 101 mg/dL — AB (ref 70–99)
Potassium: 3.9 mmol/L (ref 3.5–5.3)
SODIUM: 141 mmol/L (ref 135–146)
Total Protein: 6.2 g/dL (ref 6.1–8.1)

## 2017-01-24 LAB — LYME ABY, WSTRN BLT IGG & IGM W/BANDS
B BURGDORFERI IGM ABS (IB): NEGATIVE
B burgdorferi IgG Abs (IB): NEGATIVE
LYME DISEASE 28 KD IGG: NONREACTIVE
LYME DISEASE 30 KD IGG: NONREACTIVE
LYME DISEASE 41 KD IGG: REACTIVE — AB
LYME DISEASE 45 KD IGG: NONREACTIVE
LYME DISEASE 58 KD IGG: NONREACTIVE
LYME DISEASE 66 KD IGG: NONREACTIVE
LYME DISEASE 93 KD IGG: NONREACTIVE
Lyme Disease 18 kD IgG: NONREACTIVE
Lyme Disease 23 kD IgG: NONREACTIVE
Lyme Disease 23 kD IgM: NONREACTIVE
Lyme Disease 39 kD IgG: NONREACTIVE
Lyme Disease 39 kD IgM: NONREACTIVE
Lyme Disease 41 kD IgM: NONREACTIVE

## 2017-01-24 LAB — CK: Total CK: 85 U/L (ref 29–143)

## 2017-01-24 LAB — SEDIMENTATION RATE: SED RATE: 4 mm/h (ref 0–30)

## 2017-01-24 LAB — ANA: Anti Nuclear Antibody(ANA): NEGATIVE

## 2017-01-28 ENCOUNTER — Encounter: Payer: Self-pay | Admitting: *Deleted

## 2017-02-25 ENCOUNTER — Encounter: Payer: Self-pay | Admitting: Family Medicine

## 2017-02-25 ENCOUNTER — Ambulatory Visit (INDEPENDENT_AMBULATORY_CARE_PROVIDER_SITE_OTHER): Payer: Medicare Other | Admitting: Family Medicine

## 2017-02-25 VITALS — BP 130/72 | HR 98 | Temp 97.7°F | Resp 16 | Ht 61.5 in | Wt 144.0 lb

## 2017-02-25 DIAGNOSIS — H9202 Otalgia, left ear: Secondary | ICD-10-CM

## 2017-02-25 DIAGNOSIS — Z23 Encounter for immunization: Secondary | ICD-10-CM | POA: Diagnosis not present

## 2017-02-25 MED ORDER — NEOMYCIN-POLYMYXIN-HC 3.5-10000-1 OT SOLN: 4.0000 [drp] | Freq: Four times a day (QID) | OTIC | 0 refills | Status: DC

## 2017-02-25 NOTE — Progress Notes (Signed)
Subjective:    Patient ID: Cheryl Skinner, female    DOB: 11/27/1945, 71 y.o.   MRN: [id number]  HPI  Patient is a very pleasant 71 year old white female who complains of pain in the left ear. She woke up this morning and scratch inside the left ear canal and felt pain after scratching. Shortly thereafter she noticed blood on her pillow. On examination today, there is a small laceration on the floor of the left auditory canal. There is dried blood in the ear canal. There is clotted blood against the tympanic membrane but no evidence of tympanic membrane perforation. She denies any hearing loss or vertigo or fever or chills Past Medical History:  Diagnosis Date  . Hyperlipidemia   . Hypertension   . TMJ (temporomandibular joint syndrome)    Past Surgical History:  Procedure Laterality Date  . ABDOMINAL HYSTERECTOMY     Current Outpatient Prescriptions on File Prior to Visit  Medication Sig Dispense Refill  . atorvastatin (LIPITOR) 40 MG tablet TAKE ONE TABLET BY MOUTH ONCE DAILY 30 tablet 5  . cyclobenzaprine (FLEXERIL) 10 MG tablet Take 10 mg by mouth 3 (three) times daily as needed (TMJ).    . diclofenac (VOLTAREN) 75 MG EC tablet Take 1 tablet (75 mg total) by mouth 2 (two) times daily. PRN TMJ 60 tablet 3  . losartan-hydrochlorothiazide (HYZAAR) 100-25 MG tablet TAKE ONE TABLET BY MOUTH ONCE DAILY 30 tablet 5  . meclizine (ANTIVERT) 25 MG tablet Take 1 tablet (25 mg total) by mouth 3 (three) times daily as needed for dizziness. 30 tablet 1   No current facility-administered medications on file prior to visit.    Allergies  Allergen Reactions  . Ampicillin     REACTION: makes sick  . Codeine     REACTION: loses memory  . Hydrocodone     REACTION: makes sick  . Minocycline Hcl     REACTION: makes sick  . Nitrofurantoin     REACTION: makes sick  . Oxycodone Hcl     REACTION: loses memory  . Propoxyphene Hcl     REACTION: makes sick  . Propoxyphene N-Acetaminophen    REACTION: makes sick   Social History   Social History  . Marital status: Married    Spouse name: N/A  . Number of children: N/A  . Years of education: N/A   Occupational History  . Not on file.   Social History Main Topics  . Smoking status: Former Smoker    Types: Cigarettes  . Smokeless tobacco: Never Used  . Alcohol use Yes     Comment: Rare  . Drug use: No  . Sexual activity: Not on file   Other Topics Concern  . Not on file   Social History Narrative  . No narrative on file     Review of Systems  All other systems reviewed and are negative.      Objective:   Physical Exam  Constitutional: She appears well-developed and well-nourished.  HENT:  Right Ear: Tympanic membrane, external ear and ear canal normal.  Left Ear: Tympanic membrane and external ear normal. Left ear exhibits lacerations. Tympanic membrane is not scarred, not perforated, not erythematous, not retracted and not bulging.  Ears:  Nose: Nose normal.  Mouth/Throat: Oropharynx is clear and moist. No oropharyngeal exudate.  Neck: Neck supple.  Cardiovascular: Normal rate, regular rhythm and normal heart sounds.   Pulmonary/Chest: Effort normal and breath sounds normal. No respiratory distress. She has no wheezes. She  has no rales.  Vitals reviewed.   Small lac on the floor of auditory canal.        Assessment & Plan:  Flu vaccine need - Plan: Flu Vaccine QUAD 36+ mos IM  Acute otalgia, left  I suspect that the patient has otitis externa and then caused a laceration via trauma from scratching. Start the patient on Cortisporin HC otic 4 drops in the left auditory canal 4 times a day for the next week and then reassess if no better in 1 week or sooner if worse.

## 2017-04-09 ENCOUNTER — Telehealth: Payer: Self-pay | Admitting: Family Medicine

## 2017-04-09 NOTE — Telephone Encounter (Signed)
Ok to refill 

## 2017-04-09 NOTE — Telephone Encounter (Signed)
Pt wants to know if we can call in flexeril to [facility] for her tmj, states he has called it in before.

## 2017-04-10 MED ORDER — CYCLOBENZAPRINE HCL 10 MG PO TABS: 10.0000 mg | ORAL_TABLET | Freq: Three times a day (TID) | ORAL | 0 refills | Status: DC | PRN

## 2017-04-10 NOTE — Telephone Encounter (Signed)
Medication called/sent to requested pharmacy and pt aware via vm 

## 2017-04-10 NOTE — Telephone Encounter (Signed)
sure

## 2017-04-14 ENCOUNTER — Encounter: Payer: Self-pay | Admitting: Physician Assistant

## 2017-04-14 ENCOUNTER — Other Ambulatory Visit: Payer: Self-pay

## 2017-04-14 ENCOUNTER — Ambulatory Visit (INDEPENDENT_AMBULATORY_CARE_PROVIDER_SITE_OTHER): Payer: Medicare Other | Admitting: Physician Assistant

## 2017-04-14 VITALS — BP 132/72 | HR 73 | Temp 97.9°F | Resp 14 | Ht 61.5 in | Wt 146.8 lb

## 2017-04-14 DIAGNOSIS — B9689 Other specified bacterial agents as the cause of diseases classified elsewhere: Secondary | ICD-10-CM

## 2017-04-14 DIAGNOSIS — J988 Other specified respiratory disorders: Secondary | ICD-10-CM | POA: Diagnosis not present

## 2017-04-14 MED ORDER — AZITHROMYCIN 250 MG PO TABS: ORAL_TABLET | ORAL | 0 refills | Status: DC

## 2017-04-14 NOTE — Progress Notes (Signed)
Patient ID: Cheryl Skinner MRN: [id number], DOB: [birth year], 71 y.o. Date of Encounter: 04/14/2017, 1:08 PM    Chief Complaint:  Chief Complaint  Patient presents with  . Cough    x1 week   . Nasal Congestion     HPI: 71 y.o. year old female presents wit above.   She reports that symptoms started with a scratchy throat and drainage in her throat.  Then moved down into her chest causing chest congestion and cough.  Also having nasal congestion and blowing mucus from her nose.  Has had no known fevers or chills.     Home Meds:   Outpatient Medications Prior to Visit  Medication Sig Dispense Refill  . atorvastatin (LIPITOR) 40 MG tablet TAKE ONE TABLET BY MOUTH ONCE DAILY 30 tablet 5  . cyclobenzaprine (FLEXERIL) 10 MG tablet Take 1 tablet (10 mg total) 3 (three) times daily as needed by mouth (TMJ). 30 tablet 0  . diclofenac (VOLTAREN) 75 MG EC tablet Take 1 tablet (75 mg total) by mouth 2 (two) times daily. PRN TMJ 60 tablet 3  . losartan-hydrochlorothiazide (HYZAAR) 100-25 MG tablet TAKE ONE TABLET BY MOUTH ONCE DAILY 30 tablet 5  . meclizine (ANTIVERT) 25 MG tablet Take 1 tablet (25 mg total) by mouth 3 (three) times daily as needed for dizziness. 30 tablet 1  . neomycin-polymyxin-hydrocortisone (CORTISPORIN) OTIC solution Place 4 drops into the left ear 4 (four) times daily. 10 mL 0   No facility-administered medications prior to visit.     Allergies:  Allergies  Allergen Reactions  . Ampicillin     REACTION: makes sick  . Codeine     REACTION: loses memory  . Hydrocodone     REACTION: makes sick  . Minocycline Hcl     REACTION: makes sick  . Nitrofurantoin     REACTION: makes sick  . Oxycodone Hcl     REACTION: loses memory  . Propoxyphene Hcl     REACTION: makes sick  . Propoxyphene N-Acetaminophen     REACTION: makes sick      Review of Systems: See HPI for pertinent ROS. All other ROS negative.    Physical Exam: Blood pressure 132/72, pulse 73,  temperature 97.9 F (36.6 C), temperature source Oral, resp. rate 14, height 5' 1.5" (1.562 m), weight 66.6 kg (146 lb 12.8 oz), SpO2 97 %., Body mass index is 27.29 kg/m. General:  WNWD WF. Appears in no acute distress. HEENT: Normocephalic, atraumatic, eyes without discharge, sclera non-icteric, nares are without discharge. Bilateral auditory canals clear, TM's are without perforation, pearly grey and translucent with reflective cone of light bilaterally. Oral cavity moist, posterior pharynx without exudate, erythema, peritonsillar abscess.  No tenderness with percussion to frontal or maxillary sinuses bilaterally.  Neck: Supple. No thyromegaly. No lymphadenopathy. Lungs: Clear bilaterally to auscultation without wheezes, rales, or rhonchi. Breathing is unlabored. Heart: Regular rhythm. No murmurs, rubs, or gallops. Msk:  Strength and tone normal for age. Extremities/Skin: Warm and dry.  Neuro: Alert and oriented X 3. Moves all extremities spontaneously. Gait is normal. CNII-XII grossly in tact. Psych:  Responds to questions appropriately with a normal affect.     ASSESSMENT AND PLAN:  71 y.o. year old female with  1. Bacterial respiratory infection She is to take antibiotic as directed.  Follow-up if symptoms do not resolve within 1 week after completion of antibiotic. - azithromycin (ZITHROMAX) 250 MG tablet; Day 1: Take 2 daily. Days 2 -5: Take 1 daily.  Dispense: 6 tablet; Refill: 0   Signed, [address] [place], [place], [facility] 04/14/2017 1:08 PM

## 2017-04-15 DIAGNOSIS — Z23 Encounter for immunization: Secondary | ICD-10-CM | POA: Diagnosis not present

## 2017-04-15 DIAGNOSIS — D1801 Hemangioma of skin and subcutaneous tissue: Secondary | ICD-10-CM | POA: Diagnosis not present

## 2017-04-15 DIAGNOSIS — C44612 Basal cell carcinoma of skin of right upper limb, including shoulder: Secondary | ICD-10-CM | POA: Diagnosis not present

## 2017-04-15 DIAGNOSIS — L723 Sebaceous cyst: Secondary | ICD-10-CM | POA: Diagnosis not present

## 2017-04-15 DIAGNOSIS — D485 Neoplasm of uncertain behavior of skin: Secondary | ICD-10-CM | POA: Diagnosis not present

## 2017-04-15 DIAGNOSIS — D225 Melanocytic nevi of trunk: Secondary | ICD-10-CM | POA: Diagnosis not present

## 2017-04-15 DIAGNOSIS — T148XXA Other injury of unspecified body region, initial encounter: Secondary | ICD-10-CM | POA: Diagnosis not present

## 2017-04-15 DIAGNOSIS — L814 Other melanin hyperpigmentation: Secondary | ICD-10-CM | POA: Diagnosis not present

## 2017-04-15 DIAGNOSIS — L821 Other seborrheic keratosis: Secondary | ICD-10-CM | POA: Diagnosis not present

## 2017-04-30 ENCOUNTER — Other Ambulatory Visit: Payer: Self-pay | Admitting: Family Medicine

## 2017-05-07 ENCOUNTER — Other Ambulatory Visit: Payer: Self-pay | Admitting: Family Medicine

## 2017-05-08 DIAGNOSIS — C44519 Basal cell carcinoma of skin of other part of trunk: Secondary | ICD-10-CM | POA: Diagnosis not present

## 2017-05-21 ENCOUNTER — Other Ambulatory Visit: Payer: Self-pay | Admitting: Family Medicine

## 2017-06-12 ENCOUNTER — Other Ambulatory Visit: Payer: Self-pay | Admitting: Family Medicine

## 2017-06-12 NOTE — Telephone Encounter (Signed)
Ok to refill 

## 2017-06-19 ENCOUNTER — Telehealth: Payer: Self-pay | Admitting: Family Medicine

## 2017-06-19 NOTE — Telephone Encounter (Signed)
Patient would like to speak to you regarding her losartin  [phone]

## 2017-06-19 NOTE — Telephone Encounter (Signed)
LMTRC

## 2017-06-24 NOTE — Telephone Encounter (Signed)
After a lot of phone tag LMOVM regarding the recall on losartan and informed her to check with pharmacy to see if her medication was on the recall list and if she had any further questions to please try to call me back.

## 2017-06-25 ENCOUNTER — Telehealth: Payer: Self-pay | Admitting: Family Medicine

## 2017-06-25 NOTE — Telephone Encounter (Signed)
Losartan/HCT on back order can we change this for pt as per pharm request??

## 2017-06-26 MED ORDER — LISINOPRIL-HYDROCHLOROTHIAZIDE 20-12.5 MG PO TABS: 2.0000 | ORAL_TABLET | Freq: Every day | ORAL | 3 refills | Status: DC

## 2017-06-26 NOTE — Telephone Encounter (Signed)
Medication called/sent to requested pharmacy  

## 2017-06-26 NOTE — Telephone Encounter (Signed)
Switch to lisinopril hctz 40/25 poqday and recheck bp in 1 month. DC hyzaar.

## 2017-07-09 DIAGNOSIS — Z1231 Encounter for screening mammogram for malignant neoplasm of breast: Secondary | ICD-10-CM | POA: Diagnosis not present

## 2017-07-21 ENCOUNTER — Other Ambulatory Visit: Payer: Self-pay | Admitting: Family Medicine

## 2017-08-25 ENCOUNTER — Other Ambulatory Visit: Payer: Self-pay | Admitting: Family Medicine

## 2017-08-25 NOTE — Telephone Encounter (Signed)
Requesting refill    Flexeril  LOV: 02/25/17  LRF:  07/21/17

## 2017-11-24 ENCOUNTER — Other Ambulatory Visit: Payer: Self-pay | Admitting: Family Medicine

## 2017-11-24 NOTE — Telephone Encounter (Signed)
Ok to refill 

## 2017-12-23 ENCOUNTER — Other Ambulatory Visit: Payer: Self-pay | Admitting: Family Medicine

## 2018-01-07 ENCOUNTER — Encounter: Payer: Self-pay | Admitting: Family Medicine

## 2018-01-07 ENCOUNTER — Ambulatory Visit (INDEPENDENT_AMBULATORY_CARE_PROVIDER_SITE_OTHER): Payer: Medicare Other | Admitting: Family Medicine

## 2018-01-07 ENCOUNTER — Other Ambulatory Visit: Payer: Self-pay

## 2018-01-07 VITALS — BP 110/70 | HR 104 | Temp 97.7°F | Resp 15 | Ht 61.5 in | Wt 145.0 lb

## 2018-01-07 DIAGNOSIS — I10 Essential (primary) hypertension: Secondary | ICD-10-CM

## 2018-01-07 DIAGNOSIS — E785 Hyperlipidemia, unspecified: Secondary | ICD-10-CM | POA: Diagnosis not present

## 2018-01-07 DIAGNOSIS — J329 Chronic sinusitis, unspecified: Secondary | ICD-10-CM | POA: Diagnosis not present

## 2018-01-07 DIAGNOSIS — J069 Acute upper respiratory infection, unspecified: Secondary | ICD-10-CM

## 2018-01-07 MED ORDER — IPRATROPIUM-ALBUTEROL 0.5-2.5 (3) MG/3ML IN SOLN
3.0000 mL | Freq: Once | RESPIRATORY_TRACT | Status: AC
  Administered 2018-01-07: 3 mL via RESPIRATORY_TRACT

## 2018-01-07 MED ORDER — BENZONATATE 100 MG PO CAPS: 100.0000 mg | ORAL_CAPSULE | Freq: Three times a day (TID) | ORAL | 0 refills | Status: DC | PRN

## 2018-01-07 MED ORDER — ALBUTEROL SULFATE HFA 108 (90 BASE) MCG/ACT IN AERS: 2.0000 | INHALATION_SPRAY | RESPIRATORY_TRACT | 0 refills | Status: DC | PRN

## 2018-01-07 MED ORDER — PREDNISONE 20 MG PO TABS: 40.0000 mg | ORAL_TABLET | Freq: Every day | ORAL | 0 refills | Status: AC

## 2018-01-07 MED ORDER — AZITHROMYCIN 250 MG PO TABS: ORAL_TABLET | ORAL | 0 refills | Status: DC

## 2018-01-07 MED ORDER — FLUTICASONE PROPIONATE 50 MCG/ACT NA SUSP: 2.0000 | Freq: Every day | NASAL | 0 refills | Status: DC

## 2018-01-07 NOTE — Patient Instructions (Addendum)
For your chronic and recently worsening nasal symptoms, start a daily antihistamine like zyrtec, claritin or allerga - take every night for the next several weeks.  While you have a lot of nasal drainage you can also take a decongestant if you tolerate it.  And I would start a steroid nasal spray and some nasal saline to help move discharge out of your nasal cavities.  Over the counter meds are Zyrtec, claritin or allegra. Nasal sprays - flonase, nasonex or generic equivalent.  For cough start taking mucinex daily with ample water  Prescription medication with be a 5 day burst of steroids, take in the morning with food.  Albuterol inhaler - recommend use with coughing fits, and before bedtime to help prevent wheezy cough.   Tessalon is a cough med.    If you have any worsening of your symptoms please come for a recheck.   A Z-pak may have very minimal benefit to your lungs, but it does not cover infections in your sinuses very well, and I do believe you have a virus, which will usually run it course and be self limiting.      If your sinuses become suddenly very painful - please let us know, zpak may not treat adequately.  If your breathing does not improve in the next 1-2 weeks (should feel mostly better, but its common to have cough linger) please let us recheck you.   Upper Respiratory Infection, Adult Most upper respiratory infections (URIs) are caused by a virus. A URI affects the nose, throat, and upper air passages. The most common type of URI is often called "the common cold." Follow these instructions at home:  Take medicines only as told by your doctor.  Gargle warm saltwater or take cough drops to comfort your throat as told by your doctor.  Use a warm mist humidifier or inhale steam from a shower to increase air moisture. This may make it easier to breathe.  Drink enough fluid to keep your pee (urine) clear or pale yellow.  Eat soups and other clear broths.  Have a  healthy diet.  Rest as needed.  Go back to work when your fever is gone or your doctor says it is okay. ? You may need to stay home longer to avoid giving your URI to others. ? You can also wear a face mask and wash your hands often to prevent spread of the virus.  Use your inhaler more if you have asthma.  Do not use any tobacco products, including cigarettes, chewing tobacco, or electronic cigarettes. If you need help quitting, ask your doctor. Contact a doctor if:  You are getting worse, not better.  Your symptoms are not helped by medicine.  You have chills.  You are getting more short of breath.  You have brown or red mucus.  You have yellow or brown discharge from your nose.  You have pain in your face, especially when you bend forward.  You have a fever.  You have puffy (swollen) neck glands.  You have pain while swallowing.  You have white areas in the back of your throat. Get help right away if:  You have very bad or constant: ? Headache. ? Ear pain. ? Pain in your forehead, behind your eyes, and over your cheekbones (sinus pain). ? Chest pain.  You have long-lasting (chronic) lung disease and any of the following: ? Wheezing. ? Long-lasting cough. ? Coughing up blood. ? A change in your usual mucus.  You  have a stiff neck.  You have changes in your: ? Vision. ? Hearing. ? Thinking. ? Mood. This information is not intended to replace advice given to you by your health care provider. Make sure you discuss any questions you have with your health care provider. Document Released: 10/30/2007 Document Revised: 01/14/2016 Document Reviewed: 08/18/2013 Elsevier Interactive Patient Education  2018 [employer].

## 2018-01-07 NOTE — Progress Notes (Signed)
Patient ID: Cheryl Skinner, female    DOB: 1946-04-19, 72 y.o.   MRN: [id number]  PCP: Susy Frizzle, MD  Chief Complaint  Patient presents with  . Cough    Patient has c/o cough, and runny nose    Subjective:   Cheryl Skinner is a 72 y.o. female, presents to clinic with CC of cough and runny nose. Patient is a 72 year old female with history of hyperlipidemia and hypertension, she presents to clinic with cough that has been intermittent all summer she believes related to allergies.  It has gotten better and worse for the past several months seems to be associated with her nasal discharge and depending on what is blooming at the time.  It had began to improve over the last month but she did go [place] the [place] and she developed worsening nasal drainage and worsening nonproductive cough, onset of acute worsening was 1 week ago.  She had associated body aches, low-grade fever, felt generally ill for 1 to 2 days.  Cough is very "wet sounding" but no productive sputum.  Nasal discharge is clear associated sneezing.  She had minor 1 to 2 days of throat irritation that is resolved.  She reports that she is prone to bronchitis but believes last last time she was ill was at least 1 year ago.  She has no asthma history or lung disease.  She was a former smoker over 20 years ago briefly smoked small amounts while dealing with an ill family member.  She has not tried any over-the-counter medications.  T-max is 99.3.  This does not feel severe in her chest as her past episodes of bronchitis.  In the past it feel deeper in her anterior central chest with sensation that an elephant was sitting on her chest with associated metallic taste when coughing.  Currently her cough is worse at night when laying down and improves throughout the day.  No wheeze, chest tightness, chest pain, shortness of breath, weight loss, chest pain.  Patient Active Problem List   Diagnosis Date Noted  . Hyperlipidemia   .  Hypertension   . TMJ (temporomandibular joint syndrome)      Prior to Admission medications   Medication Sig Start Date End Date Taking? Authorizing Provider  atorvastatin (LIPITOR) 40 MG tablet TAKE 1 TABLET BY MOUTH ONCE DAILY 11/24/17  Yes Leon, Modena Nunnery, MD  cyclobenzaprine (FLEXERIL) 10 MG tablet TAKE 1 TABLET BY MOUTH THREE TIMES DAILY AS NEEDED 11/25/17  Yes Susy Frizzle, MD  diclofenac (VOLTAREN) 75 MG EC tablet TAKE 1 TABLET BY MOUTH TWICE DAILY AS NEEDED 12/23/17  Yes Susy Frizzle, MD  lisinopril-hydrochlorothiazide (ZESTORETIC) 20-12.5 MG tablet Take 2 tablets by mouth daily. 06/26/17  Yes Susy Frizzle, MD  meclizine (ANTIVERT) 25 MG tablet Take 1 tablet (25 mg total) by mouth 3 (three) times daily as needed for dizziness. 05/14/16  Yes Susy Frizzle, MD     Allergies  Allergen Reactions  . Ampicillin     REACTION: makes sick  . Codeine     REACTION: loses memory  . Hydrocodone     REACTION: makes sick  . Minocycline Hcl     REACTION: makes sick  . Nitrofurantoin     REACTION: makes sick  . Oxycodone Hcl     REACTION: loses memory  . Propoxyphene Hcl     REACTION: makes sick  . Propoxyphene N-Acetaminophen     REACTION: makes sick  History reviewed. No pertinent family history.   Social History   Socioeconomic History  . Marital status: Married    Spouse name: Not on file  . Number of children: Not on file  . Years of education: Not on file  . Highest education level: Not on file  Occupational History  . Not on file  Social Needs  . Financial resource strain: Not on file  . Food insecurity:    Worry: Not on file    Inability: Not on file  . Transportation needs:    Medical: Not on file    Non-medical: Not on file  Tobacco Use  . Smoking status: Former Smoker    Types: Cigarettes  . Smokeless tobacco: Never Used  Substance and Sexual Activity  . Alcohol use: Yes    Comment: Rare  . Drug use: No  . Sexual activity: Not on  file  Lifestyle  . Physical activity:    Days per week: Not on file    Minutes per session: Not on file  . Stress: Not on file  Relationships  . Social connections:    Talks on phone: Not on file    Gets together: Not on file    Attends religious service: Not on file    Active member of club or organization: Not on file    Attends meetings of clubs or organizations: Not on file    Relationship status: Not on file  . Intimate partner violence:    Fear of current or ex partner: Not on file    Emotionally abused: Not on file    Physically abused: Not on file    Forced sexual activity: Not on file  Other Topics Concern  . Not on file  Social History Narrative  . Not on file     Review of Systems  Constitutional: Negative.   HENT: Negative.   Eyes: Negative.   Respiratory: Negative.   Cardiovascular: Negative.   Gastrointestinal: Negative.   Endocrine: Negative.   Genitourinary: Negative.   Musculoskeletal: Negative.   Skin: Negative.   Allergic/Immunologic: Negative.   Neurological: Negative.   Hematological: Negative.   Psychiatric/Behavioral: Negative.   All other systems reviewed and are negative.      Objective:    Vitals:   01/07/18 0811  BP: 110/70  Pulse: (!) 104  Resp: 15  Temp: 97.7 F (36.5 C)  TempSrc: Oral  SpO2: 97%  Weight: 145 lb (65.8 kg)  Height: 5' 1.5" (1.562 m)      Physical Exam  Constitutional: She is oriented to person, place, and time. She appears well-developed and well-nourished.  Non-toxic appearance. No distress.  Mildly ill appearing female, NAD, non-toxic  HENT:  Head: Normocephalic and atraumatic.  Right Ear: External ear normal.  Left Ear: External ear normal.  Mouth/Throat: Uvula is midline, oropharynx is clear and moist and mucous membranes are normal.  Nasal mucosa erythematous with scant purulent discharge and nasal congestion.  No sinus ttp OP normal No cervical lymphadenopathy  Eyes: Pupils are equal, round,  and reactive to light. Conjunctivae, EOM and lids are normal.  Neck: Normal range of motion and phonation normal. Neck supple. No tracheal deviation present.  Cardiovascular: Normal rate, regular rhythm, normal heart sounds, intact distal pulses and normal pulses. Exam reveals no gallop and no friction rub.  No murmur heard. Pulses:      Radial pulses are 2+ on the right side, and 2+ on the left side.  Posterior tibial pulses are 2+ on the right side, and 2+ on the left side.  No LE edema  Pulmonary/Chest: Effort normal and breath sounds normal. No stridor. No respiratory distress. She has no wheezes. She has no rhonchi. She has no rales. She exhibits no tenderness.  Audible Wheezy coughing fits frequently while examining pt, with deep inspiration and auscultation no wheeze in all lung fields, no rhonchi no rales  Abdominal: Soft. Normal appearance and bowel sounds are normal. She exhibits no distension. There is no tenderness. There is no rebound and no guarding.  Musculoskeletal: Normal range of motion. She exhibits no edema or deformity.  Lymphadenopathy:    She has no cervical adenopathy.  Neurological: She is alert and oriented to person, place, and time. She exhibits normal muscle tone. Gait normal.  Skin: Skin is warm, dry and intact. Capillary refill takes less than 2 seconds. No rash noted. She is not diaphoretic. No pallor.  Psychiatric: She has a normal mood and affect. Her speech is normal and behavior is normal.  Nursing note and vitals reviewed.         Assessment & Plan:      ICD-10-CM   1. Upper respiratory tract infection, unspecified type J06.9 ipratropium-albuterol (DUONEB) 0.5-2.5 (3) MG/3ML nebulizer solution 3 mL    benzonatate (TESSALON) 100 MG capsule    predniSONE (DELTASONE) 20 MG tablet    albuterol (PROVENTIL HFA;VENTOLIN HFA) 108 (90 Base) MCG/ACT inhaler  2. Chronic sinusitis, unspecified location J32.9 predniSONE (DELTASONE) 20 MG tablet     fluticasone (FLONASE) 50 MCG/ACT nasal spray  3. Hypertension, unspecified type I50 COMPLETE METABOLIC PANEL WITH GFR    CBC with Differential  4. Hyperlipidemia, unspecified hyperlipidemia type E78.5 Lipid Panel    Patient with acute on chronic nasal symptoms causing nonproductive cough that has worsened acutely over the past week.  Feel it is likely a viral illness on top of chronic baseline allergies.  I have explained to the patient at length that this is most likely a viral illness and that would support and symptomatic treatment to likely feel better in 1 to 2 weeks.  In exam room she did have a audible wheeze only associated with her coughing and some minimally diminished breath sounds on exam so a breathing treatment was tried in office to see if she would note any improvement in her coughing symptoms.  She is reexamined afterwards of breath sounds were improved in bilateral middle and lower lung fields. Do believe she has URI versus early bronchitis and treatment would be the same.  Liver chronic sinusitis is mostly allergic, currently no indication for antibiotic use for her sinusitis and I have advised steroids, antihistamines and nasal sprays. Pt clearly did express her desire to have antibiotics however I did explain that antibiotics will not treat a viral illness which I do suspect that it is at this time.  Asked her to wait several days for steroids and allergy controlling medicines to help take effect and if she is not feeling better she can start Z-Pak roughly 5 days from now.  It was printed for her to hold.  I also explained that it would not be the best first-line treatment for acute bacterial sinusitis, does have some barriers to treatment there because of her antibiotic allergies.  She was encouraged to follow-up if not improving in 2 weeks and was advised that likely mild cough with be the last symptom to go and cannot commonly lasts for 2 to 3 weeks.  Before patient left after her  evaluation she did ask if her Lipitor could be refilled because she is out of refills and would be put to the pharmacy until she had a office visit.  With a very quick chart review does not seem that there are any CPEs or routine visits for management of her blood pressure and high cholesterol.  For the last 2 years or so she only has OV for acute illness and further there is a specific note put in with her last refill July 1 for 30-day supply that there to be no more refills until she has an office visit.  Was fasting this morning so basic lab work was obtained.     Delsa Grana, PA-C 01/07/18 8:31 AM

## 2018-01-08 LAB — LIPID PANEL
CHOLESTEROL: 256 mg/dL — AB (ref <200)
HDL: 42 mg/dL — ABNORMAL LOW (ref >50)
LDL CHOLESTEROL (CALC): 184 mg/dL — AB
Non-HDL Cholesterol (Calc): 214 mg/dL (calc) — ABNORMAL HIGH (ref <130)
TRIGLYCERIDES: 156 mg/dL — AB (ref <150)
Total CHOL/HDL Ratio: 6.1 (calc) — ABNORMAL HIGH (ref <5.0)

## 2018-01-08 LAB — COMPLETE METABOLIC PANEL WITH GFR
AG Ratio: 1.6 (calc) (ref 1.0–2.5)
ALBUMIN MSPROF: 3.9 g/dL (ref 3.6–5.1)
ALKALINE PHOSPHATASE (APISO): 53 U/L (ref 33–130)
ALT: 29 U/L (ref 6–29)
AST: 20 U/L (ref 10–35)
BILIRUBIN TOTAL: 0.4 mg/dL (ref 0.2–1.2)
BUN: 14 mg/dL (ref 7–25)
CHLORIDE: 104 mmol/L (ref 98–110)
CO2: 27 mmol/L (ref 20–32)
Calcium: 9.4 mg/dL (ref 8.6–10.4)
Creat: 0.85 mg/dL (ref 0.60–0.93)
GFR, Est African American: 79 mL/min/{1.73_m2} (ref >=60)
GFR, Est Non African American: 68 mL/min/{1.73_m2} (ref >=60)
GLOBULIN: 2.5 g/dL (ref 1.9–3.7)
Glucose, Bld: 98 mg/dL (ref 65–99)
Potassium: 3.9 mmol/L (ref 3.5–5.3)
Sodium: 141 mmol/L (ref 135–146)
Total Protein: 6.4 g/dL (ref 6.1–8.1)

## 2018-01-08 LAB — CBC WITH DIFFERENTIAL/PLATELET
Basophils Absolute: 68 cells/uL (ref 0–200)
Basophils Relative: 0.9 %
EOS PCT: 3 %
Eosinophils Absolute: 228 cells/uL (ref 15–500)
HCT: 40.2 % (ref 35.0–45.0)
Hemoglobin: 13.4 g/dL (ref 11.7–15.5)
Lymphs Abs: 2827 cells/uL (ref 850–3900)
MCH: 29.6 pg (ref 27.0–33.0)
MCHC: 33.3 g/dL (ref 32.0–36.0)
MCV: 88.7 fL (ref 80.0–100.0)
MPV: 11.5 fL (ref 7.5–12.5)
Monocytes Relative: 8.6 %
NEUTROS PCT: 50.3 %
Neutro Abs: 3823 cells/uL (ref 1500–7800)
PLATELETS: 247 10*3/uL (ref 140–400)
RBC: 4.53 10*6/uL (ref 3.80–5.10)
RDW: 12.1 % (ref 11.0–15.0)
Total Lymphocyte: 37.2 %
WBC: 7.6 10*3/uL (ref 3.8–10.8)
WBCMIX: 654 {cells}/uL (ref 200–950)

## 2018-01-08 LAB — SPECIMEN COMPROMISED

## 2018-01-12 ENCOUNTER — Other Ambulatory Visit: Payer: Self-pay | Admitting: Family Medicine

## 2018-01-12 MED ORDER — ATORVASTATIN CALCIUM 40 MG PO TABS: 40.0000 mg | ORAL_TABLET | Freq: Every day | ORAL | 3 refills | Status: DC

## 2018-02-19 ENCOUNTER — Other Ambulatory Visit: Payer: Self-pay | Admitting: Family Medicine

## 2018-02-19 MED ORDER — MECLIZINE HCL 25 MG PO TABS: 25.0000 mg | ORAL_TABLET | Freq: Three times a day (TID) | ORAL | 1 refills | Status: DC | PRN

## 2018-02-25 ENCOUNTER — Ambulatory Visit (INDEPENDENT_AMBULATORY_CARE_PROVIDER_SITE_OTHER): Payer: Medicare Other | Admitting: Family Medicine

## 2018-02-25 ENCOUNTER — Other Ambulatory Visit: Payer: Self-pay

## 2018-02-25 ENCOUNTER — Other Ambulatory Visit: Payer: Self-pay | Admitting: Family Medicine

## 2018-02-25 ENCOUNTER — Encounter: Payer: Self-pay | Admitting: Family Medicine

## 2018-02-25 VITALS — BP 134/78 | HR 78 | Temp 98.1°F | Resp 16 | Ht 61.5 in | Wt 146.0 lb

## 2018-02-25 DIAGNOSIS — R062 Wheezing: Secondary | ICD-10-CM

## 2018-02-25 DIAGNOSIS — J209 Acute bronchitis, unspecified: Secondary | ICD-10-CM | POA: Diagnosis not present

## 2018-02-25 DIAGNOSIS — J329 Chronic sinusitis, unspecified: Secondary | ICD-10-CM | POA: Diagnosis not present

## 2018-02-25 MED ORDER — GUAIFENESIN ER 600 MG PO TB12: 600.0000 mg | ORAL_TABLET | Freq: Two times a day (BID) | ORAL | 0 refills | Status: AC

## 2018-02-25 MED ORDER — PREDNISONE 20 MG PO TABS: ORAL_TABLET | ORAL | 0 refills | Status: DC

## 2018-02-25 MED ORDER — IPRATROPIUM-ALBUTEROL 0.5-2.5 (3) MG/3ML IN SOLN
3.0000 mL | Freq: Once | RESPIRATORY_TRACT | Status: AC
  Administered 2018-02-25: 3 mL via RESPIRATORY_TRACT

## 2018-02-25 MED ORDER — DOXYCYCLINE HYCLATE 100 MG PO TABS: 100.0000 mg | ORAL_TABLET | Freq: Two times a day (BID) | ORAL | 0 refills | Status: AC

## 2018-02-25 MED ORDER — BENZONATATE 100 MG PO CAPS: 100.0000 mg | ORAL_CAPSULE | Freq: Three times a day (TID) | ORAL | 0 refills | Status: DC | PRN

## 2018-02-25 NOTE — Telephone Encounter (Signed)
Ok to refill 

## 2018-02-25 NOTE — Progress Notes (Signed)
Patient ID: Cheryl Skinner, female    DOB: 1946-05-22, 72 y.o.   MRN: [id number]  PCP: Susy Frizzle, MD  Chief Complaint  Patient presents with  . Illness    x weeks- nonproductive cough, sneezing    Subjective:   Cheryl Skinner is a 72 y.o. female, presents to clinic with CC of several weeks of nasal congestion and waxing and waning dry wheezy cough with chest congestion.  She was seen almost 2 months ago for similar symptoms, at first her allergies improved with treatment but then she discontinued use of allergy medications and no longer had a wheeze until roughly last week.  She states that her nasal symptoms have gradually worsened over the last couple weeks, she does tend to have some nasal symptoms and sneezing with weather changes particularly in spring and fall.  She states that her nasal congestion and discharge and sneezing has been ongoing for almost 2 months but gradually worsening over the past 2 to 3 weeks.  Her cough returned following the worsening nasal congestion, is nonproductive, wheezy, she has not been using her inhaler for it and has not been treated with any other over-the-counter medications.  She feels mildly ill but denies any fever, sweats, hot or cold chills, severe fatigue, chest pain.   Patient Active Problem List   Diagnosis Date Noted  . Hyperlipidemia   . Hypertension   . TMJ (temporomandibular joint syndrome)      Prior to Admission medications   Medication Sig Start Date End Date Taking? Authorizing Provider  albuterol (PROVENTIL HFA;VENTOLIN HFA) 108 (90 Base) MCG/ACT inhaler Inhale 2 puffs into the lungs every 4 (four) hours as needed for wheezing or shortness of breath. 01/07/18  Yes Delsa Grana, PA-C  atorvastatin (LIPITOR) 40 MG tablet Take 1 tablet (40 mg total) by mouth daily. 01/12/18  Yes Susy Frizzle, MD  cyclobenzaprine (FLEXERIL) 10 MG tablet TAKE 1 TABLET BY MOUTH THREE TIMES DAILY AS NEEDED 11/25/17  Yes Susy Frizzle, MD    diclofenac (VOLTAREN) 75 MG EC tablet TAKE 1 TABLET BY MOUTH TWICE DAILY AS NEEDED 12/23/17  Yes Susy Frizzle, MD  fluticasone (FLONASE) 50 MCG/ACT nasal spray Place 2 sprays into both nostrils daily. 01/07/18  Yes Delsa Grana, PA-C  lisinopril-hydrochlorothiazide (ZESTORETIC) 20-12.5 MG tablet Take 2 tablets by mouth daily. 06/26/17  Yes Susy Frizzle, MD  meclizine (ANTIVERT) 25 MG tablet Take 1 tablet (25 mg total) by mouth 3 (three) times daily as needed for dizziness. 02/19/18  Yes Susy Frizzle, MD     History reviewed. No pertinent family history.    Review of Systems  Constitutional: Negative.   HENT: Negative.   Eyes: Negative.   Respiratory: Negative.   Cardiovascular: Negative.   Gastrointestinal: Negative.   Endocrine: Negative.   Genitourinary: Negative.   Musculoskeletal: Negative.   Skin: Negative.   Allergic/Immunologic: Negative.   Neurological: Negative.   Hematological: Negative.   Psychiatric/Behavioral: Negative.   All other systems reviewed and are negative.      Objective:    Vitals:   02/25/18 1146  BP: 134/78  Pulse: 78  Resp: 16  Temp: 98.1 F (36.7 C)  TempSrc: Oral  SpO2: 97%  Weight: 146 lb (66.2 kg)  Height: 5' 1.5" (1.562 m)      Physical Exam  Constitutional: She is oriented to person, place, and time. She appears well-developed and well-nourished.  Non-toxic appearance. No distress.  Mildly ill-appearing female, appears  stated age, nontoxic, no acute distress  HENT:  Head: Normocephalic and atraumatic.  Right Ear: External ear normal.  Left Ear: External ear normal.  Mouth/Throat: Uvula is midline, oropharynx is clear and moist and mucous membranes are normal. No oropharyngeal exudate.  Nasal mucosa edematous and erythematous with moderate discharge, mild sinus tenderness to palpation bilaterally to her maxillary and frontal sinus  Eyes: Pupils are equal, round, and reactive to light. Conjunctivae, EOM and lids are  normal. Right eye exhibits no discharge. Left eye exhibits no discharge.  Neck: Normal range of motion and phonation normal. Neck supple. No tracheal deviation present.  Cardiovascular: Normal rate, regular rhythm, normal heart sounds and normal pulses. Exam reveals no gallop and no friction rub.  No murmur heard. Pulses:      Radial pulses are 2+ on the right side, and 2+ on the left side.       Posterior tibial pulses are 2+ on the right side, and 2+ on the left side.  Pulmonary/Chest: Effort normal. No stridor. No respiratory distress. She has wheezes. She has no rhonchi. She has no rales. She exhibits no tenderness.  Expiratory wheeze, scattered rhonchi, occasional coughing without respiratory distress, retractions or accessory muscle use  Abdominal: Soft. Normal appearance and bowel sounds are normal. She exhibits no distension. There is no tenderness. There is no rebound and no guarding.  Musculoskeletal: Normal range of motion. She exhibits no edema or deformity.  Lymphadenopathy:    She has no cervical adenopathy.  Neurological: She is alert and oriented to person, place, and time. She exhibits normal muscle tone. Coordination abnormal. Gait normal.  Skin: Skin is warm, dry and intact. Capillary refill takes less than 2 seconds. No rash noted. She is not diaphoretic. No pallor.  Psychiatric: She has a normal mood and affect. Her speech is normal and behavior is normal.  Nursing note and vitals reviewed.         Assessment & Plan:      ICD-10-CM   1. Wheeze R06.2 ipratropium-albuterol (DUONEB) 0.5-2.5 (3) MG/3ML nebulizer solution 3 mL  2. Acute bronchitis, unspecified organism J20.9 guaiFENesin (MUCINEX) 600 MG 12 hr tablet    benzonatate (TESSALON) 100 MG capsule    predniSONE (DELTASONE) 20 MG tablet    ipratropium-albuterol (DUONEB) 0.5-2.5 (3) MG/3ML nebulizer solution 3 mL  3. Rhinosinusitis J32.9 doxycycline (VIBRA-TABS) 100 MG tablet    Patient with wheeze on exam that  did improve after breathing treatment.  She has had intermittent rhinosinusitis symptoms for at least 2 months that have recently worsened with some weather changes and with out continuing rhinitis and seasonal allergy medications.  With worsening nasal symptoms are been ongoing for several weeks will treat and cover for bacterial rhinosinusitis with doxycycline.  We will treat acute bronchitis with Mucinex, Tessalon, prednisone and albuterol inhaler and duonebs.  Patient states she is having very severe coughing fits at night and is having difficulty sleeping, she has several allergies and cannot do a cough medicine like Hycodan or Tussionex to help her sleep, so she was encouraged to use over-the-counter cough medicine that may help her get some sleep and she does have a breathing machine available to her, prescribed DuoNeb vials to use in the morning at night to hopefully be longer acting to allow her to rest and begin to feel better.  She was encouraged to follow-up if she is not feeling much better and the next 1 to 2 weeks although she does understand that she does need  to continue allergy medications for at least the next 1 to 2 months until there is a frost, and also understands that some of her symptoms like cough are likely to linger for several weeks, but be much less severe than they currently are.   Delsa Grana, PA-C 02/25/18 12:12 PM

## 2018-02-25 NOTE — Patient Instructions (Signed)
Take daily the antihistamine, steroid nasal spray Start taking steroids and mucinex today Do inhaler every 2-4 hours as needed for the next several days  Hold antibiotic and start if new fever, severe worsening of sinus pain and pressure, or symptoms lasting more than 10 days.

## 2018-03-23 DIAGNOSIS — H2513 Age-related nuclear cataract, bilateral: Secondary | ICD-10-CM | POA: Diagnosis not present

## 2018-04-15 DIAGNOSIS — L570 Actinic keratosis: Secondary | ICD-10-CM | POA: Diagnosis not present

## 2018-04-15 DIAGNOSIS — Z23 Encounter for immunization: Secondary | ICD-10-CM | POA: Diagnosis not present

## 2018-04-15 DIAGNOSIS — L821 Other seborrheic keratosis: Secondary | ICD-10-CM | POA: Diagnosis not present

## 2018-04-15 DIAGNOSIS — L814 Other melanin hyperpigmentation: Secondary | ICD-10-CM | POA: Diagnosis not present

## 2018-04-15 DIAGNOSIS — D225 Melanocytic nevi of trunk: Secondary | ICD-10-CM | POA: Diagnosis not present

## 2018-04-15 DIAGNOSIS — Z85828 Personal history of other malignant neoplasm of skin: Secondary | ICD-10-CM | POA: Diagnosis not present

## 2018-05-04 ENCOUNTER — Ambulatory Visit (INDEPENDENT_AMBULATORY_CARE_PROVIDER_SITE_OTHER): Payer: Medicare Other | Admitting: Family Medicine

## 2018-05-04 ENCOUNTER — Encounter: Payer: Self-pay | Admitting: Family Medicine

## 2018-05-04 VITALS — BP 152/78 | HR 72 | Temp 97.7°F | Resp 16 | Ht 61.5 in | Wt 150.0 lb

## 2018-05-04 DIAGNOSIS — I10 Essential (primary) hypertension: Secondary | ICD-10-CM

## 2018-05-04 DIAGNOSIS — Z23 Encounter for immunization: Secondary | ICD-10-CM

## 2018-05-04 DIAGNOSIS — E7800 Pure hypercholesterolemia, unspecified: Secondary | ICD-10-CM | POA: Diagnosis not present

## 2018-05-04 NOTE — Progress Notes (Signed)
Subjective:    Patient ID: Cheryl Skinner, female    DOB: 03/02/1946, 72 y.o.   MRN: [id number]  HPI  Patient presents today for follow-up of her hypertension and hyperlipidemia.  Please see lab work from August.  At that time, her total cholesterol was found to be greater than 250 and her LDL cholesterol was elevated.  The patient had been out of her Lipitor for quite some time.  She is now back on her Lipitor.  She denies any myalgias or right upper quadrant pain.  However her blood pressure is elevated today.  She is taking lisinopril HCTZ 2 tablets a day.  My nurse checked her blood pressure and found to be 152/78.  After the patient had sat for a while and relaxed, I rechecked her blood pressure and found it to be 144/78, still elevated.  She denies any chest pain shortness of breath or dyspnea on exertion.  She is due for her flu shot Past Medical History:  Diagnosis Date  . Hyperlipidemia   . Hypertension   . TMJ (temporomandibular joint syndrome)    Past Surgical History:  Procedure Laterality Date  . ABDOMINAL HYSTERECTOMY     Current Outpatient Medications on File Prior to Visit  Medication Sig Dispense Refill  . albuterol (PROVENTIL HFA;VENTOLIN HFA) 108 (90 Base) MCG/ACT inhaler Inhale 2 puffs into the lungs every 4 (four) hours as needed for wheezing or shortness of breath. 1 Inhaler 0  . atorvastatin (LIPITOR) 40 MG tablet Take 1 tablet (40 mg total) by mouth daily. 30 tablet 3  . cyclobenzaprine (FLEXERIL) 10 MG tablet TAKE 1 TABLET BY MOUTH THREE TIMES DAILY AS NEEDED 30 tablet 2  . diclofenac (VOLTAREN) 75 MG EC tablet TAKE 1 TABLET BY MOUTH TWICE DAILY AS NEEDED 60 tablet 3  . fluticasone (FLONASE) 50 MCG/ACT nasal spray Place 2 sprays into both nostrils daily. 16 g 0  . lisinopril-hydrochlorothiazide (ZESTORETIC) 20-12.5 MG tablet Take 2 tablets by mouth daily. 180 tablet 3  . meclizine (ANTIVERT) 25 MG tablet Take 1 tablet (25 mg total) by mouth 3 (three) times daily as  needed for dizziness. 30 tablet 1   No current facility-administered medications on file prior to visit.    Allergies  Allergen Reactions  . Ampicillin     REACTION: makes sick  . Codeine     REACTION: loses memory  . Hydrocodone     REACTION: makes sick  . Minocycline Hcl     REACTION: makes sick  . Nitrofurantoin     REACTION: makes sick  . Oxycodone Hcl     REACTION: loses memory  . Propoxyphene Hcl     REACTION: makes sick  . Propoxyphene N-Acetaminophen     REACTION: makes sick   Social History   Socioeconomic History  . Marital status: Married    Spouse name: Not on file  . Number of children: Not on file  . Years of education: Not on file  . Highest education level: Not on file  Occupational History  . Not on file  Social Needs  . Financial resource strain: Not on file  . Food insecurity:    Worry: Not on file    Inability: Not on file  . Transportation needs:    Medical: Not on file    Non-medical: Not on file  Tobacco Use  . Smoking status: Former Smoker    Types: Cigarettes  . Smokeless tobacco: Never Used  Substance and Sexual Activity  .  Alcohol use: Yes    Comment: Rare  . Drug use: No  . Sexual activity: Not on file  Lifestyle  . Physical activity:    Days per week: Not on file    Minutes per session: Not on file  . Stress: Not on file  Relationships  . Social connections:    Talks on phone: Not on file    Gets together: Not on file    Attends religious service: Not on file    Active member of club or organization: Not on file    Attends meetings of clubs or organizations: Not on file    Relationship status: Not on file  . Intimate partner violence:    Fear of current or ex partner: Not on file    Emotionally abused: Not on file    Physically abused: Not on file    Forced sexual activity: Not on file  Other Topics Concern  . Not on file  Social History Narrative  . Not on file     Review of Systems  All other systems reviewed  and are negative.      Objective:   Physical Exam  Constitutional: She appears well-developed and well-nourished.  HENT:  Right Ear: Tympanic membrane normal.  Nose: Nose normal.  Mouth/Throat: Oropharynx is clear and moist. No oropharyngeal exudate.  Neck: Neck supple.  Cardiovascular: Normal rate, regular rhythm and normal heart sounds.  Pulmonary/Chest: Effort normal and breath sounds normal. No respiratory distress. She has no wheezes. She has no rales.  Vitals reviewed.  ] Assessment & Plan:  Pure hypercholesterolemia  Essential hypertension  Have the patient return fasting and check a CMP and fasting lipid panel.  Ideally I like her LDL cholesterol to be well below 130.  Her blood pressure today is elevated.  Of asked the patient to check her blood pressure every day at home for the next week or so and report the values to me.  If greater than 140/90, I would add low-dose amlodipine to what she is already taking.  Await the results of her lab work and follow-up on her blood pressure.

## 2018-05-05 ENCOUNTER — Other Ambulatory Visit: Payer: Medicare Other

## 2018-05-05 NOTE — Addendum Note (Signed)
Addended by: Shary Decamp B on: 05/05/2018 12:52 PM   Modules accepted: Orders

## 2018-05-06 LAB — LIPID PANEL
Cholesterol: 157 mg/dL (ref <200)
HDL: 46 mg/dL — AB (ref >50)
LDL Cholesterol (Calc): 88 mg/dL (calc)
NON-HDL CHOLESTEROL (CALC): 111 mg/dL (ref <130)
TRIGLYCERIDES: 129 mg/dL (ref <150)
Total CHOL/HDL Ratio: 3.4 (calc) (ref <5.0)

## 2018-05-06 LAB — COMPLETE METABOLIC PANEL WITH GFR
AG Ratio: 1.8 (calc) (ref 1.0–2.5)
ALKALINE PHOSPHATASE (APISO): 48 U/L (ref 33–130)
ALT: 23 U/L (ref 6–29)
AST: 20 U/L (ref 10–35)
Albumin: 4.1 g/dL (ref 3.6–5.1)
BUN: 13 mg/dL (ref 7–25)
CALCIUM: 9.6 mg/dL (ref 8.6–10.4)
CO2: 28 mmol/L (ref 20–32)
CREATININE: 0.77 mg/dL (ref 0.60–0.93)
Chloride: 105 mmol/L (ref 98–110)
GFR, EST AFRICAN AMERICAN: 88 mL/min/{1.73_m2} (ref >=60)
GFR, Est Non African American: 76 mL/min/{1.73_m2} (ref >=60)
GLOBULIN: 2.3 g/dL (ref 1.9–3.7)
Glucose, Bld: 95 mg/dL (ref 65–99)
Potassium: 4.2 mmol/L (ref 3.5–5.3)
SODIUM: 141 mmol/L (ref 135–146)
Total Bilirubin: 0.6 mg/dL (ref 0.2–1.2)
Total Protein: 6.4 g/dL (ref 6.1–8.1)

## 2018-05-18 ENCOUNTER — Other Ambulatory Visit: Payer: Self-pay | Admitting: Family Medicine

## 2018-06-01 ENCOUNTER — Other Ambulatory Visit: Payer: Self-pay | Admitting: Family Medicine

## 2018-06-01 NOTE — Telephone Encounter (Signed)
Ok to refill 

## 2018-07-01 ENCOUNTER — Other Ambulatory Visit: Payer: Self-pay | Admitting: Family Medicine

## 2018-07-01 NOTE — Telephone Encounter (Signed)
Ok to refill 

## 2018-07-15 DIAGNOSIS — Z1231 Encounter for screening mammogram for malignant neoplasm of breast: Secondary | ICD-10-CM | POA: Diagnosis not present

## 2018-07-15 DIAGNOSIS — Z803 Family history of malignant neoplasm of breast: Secondary | ICD-10-CM | POA: Diagnosis not present

## 2018-07-15 LAB — HM MAMMOGRAPHY

## 2018-07-16 ENCOUNTER — Encounter: Payer: Self-pay | Admitting: *Deleted

## 2018-08-05 ENCOUNTER — Other Ambulatory Visit: Payer: Self-pay | Admitting: Family Medicine

## 2018-08-09 ENCOUNTER — Other Ambulatory Visit: Payer: Self-pay | Admitting: Family Medicine

## 2018-08-11 ENCOUNTER — Other Ambulatory Visit: Payer: Self-pay | Admitting: Family Medicine

## 2018-09-08 ENCOUNTER — Other Ambulatory Visit: Payer: Self-pay | Admitting: Family Medicine

## 2018-09-08 NOTE — Telephone Encounter (Signed)
Ok to refill 

## 2018-10-03 ENCOUNTER — Other Ambulatory Visit: Payer: Self-pay | Admitting: Family Medicine

## 2018-10-23 ENCOUNTER — Other Ambulatory Visit: Payer: Self-pay

## 2018-10-23 NOTE — Patient Outreach (Signed)
[facility]) Care Management  10/23/2018  JAKEISHA STRICKER [birth year] [id number]   Medication Adherence call to [name] HIPPA Compliant Voice message left with a call back number. Mrs. Jaspers is showing past due on Atorvastatin 40 mg under [place].   [place] Management Direct Dial [phone]  Fax [phone] [email or web address]

## 2018-11-04 ENCOUNTER — Other Ambulatory Visit: Payer: Self-pay | Admitting: Family Medicine

## 2018-11-04 NOTE — Telephone Encounter (Signed)
Ok to refill Flexeril? 

## 2018-12-31 ENCOUNTER — Other Ambulatory Visit: Payer: Self-pay | Admitting: Family Medicine

## 2018-12-31 NOTE — Telephone Encounter (Signed)
Ok to refill 

## 2019-02-10 ENCOUNTER — Other Ambulatory Visit: Payer: Self-pay

## 2019-02-10 NOTE — Patient Outreach (Signed)
[facility]) Care Management  02/10/2019  Cheryl Skinner Mar 13, 1946 [id number]   Medication Adherence call to [name] HIPPA Compliant Voice message left with a call back number. [name] is showing past due on Atorvastatin 40 mg under [place].   [place] Management Direct Dial [phone]  Fax [phone] [name]

## 2019-02-15 ENCOUNTER — Other Ambulatory Visit: Payer: Self-pay

## 2019-02-15 MED ORDER — ATORVASTATIN CALCIUM 40 MG PO TABS: 40.0000 mg | ORAL_TABLET | Freq: Every day | ORAL | 0 refills | Status: DC

## 2019-02-15 MED ORDER — LISINOPRIL-HYDROCHLOROTHIAZIDE 20-12.5 MG PO TABS: 2.0000 | ORAL_TABLET | Freq: Every day | ORAL | 0 refills | Status: DC

## 2019-02-15 NOTE — Telephone Encounter (Signed)
Requested Prescriptions   Pending Prescriptions Disp Refills  . diclofenac (VOLTAREN) 75 MG EC tablet 60 tablet 0    Sig: Take 1 tablet (75 mg total) by mouth 2 (two) times daily as needed.  . cyclobenzaprine (FLEXERIL) 10 MG tablet 30 tablet 0    Sig: Take 1 tablet (10 mg total) by mouth 3 (three) times daily as needed.   Signed Prescriptions Disp Refills  . lisinopril-hydrochlorothiazide (ZESTORETIC) 20-12.5 MG tablet 180 tablet 0    Sig: Take 2 tablets by mouth daily.    Authorizing Provider: Jenna Luo T    Ordering User: Vanice Sarah atorvastatin (LIPITOR) 40 MG tablet 90 tablet 0    Sig: Take 1 tablet (40 mg total) by mouth daily.    Authorizing Provider: Susy Frizzle    Ordering User: Vanice Sarah    Last OV 05/04/2018  Last written 12/31/2018

## 2019-02-16 MED ORDER — DICLOFENAC SODIUM 75 MG PO TBEC: 75.0000 mg | DELAYED_RELEASE_TABLET | Freq: Two times a day (BID) | ORAL | 0 refills | Status: DC | PRN

## 2019-02-16 MED ORDER — CYCLOBENZAPRINE HCL 10 MG PO TABS: 10.0000 mg | ORAL_TABLET | Freq: Three times a day (TID) | ORAL | 0 refills | Status: DC | PRN

## 2019-03-18 ENCOUNTER — Other Ambulatory Visit: Payer: Self-pay | Admitting: Family Medicine

## 2019-03-18 NOTE — Telephone Encounter (Signed)
Ok to refill 

## 2019-04-19 DIAGNOSIS — L820 Inflamed seborrheic keratosis: Secondary | ICD-10-CM | POA: Diagnosis not present

## 2019-04-19 DIAGNOSIS — L821 Other seborrheic keratosis: Secondary | ICD-10-CM | POA: Diagnosis not present

## 2019-04-19 DIAGNOSIS — Z85828 Personal history of other malignant neoplasm of skin: Secondary | ICD-10-CM | POA: Diagnosis not present

## 2019-04-19 DIAGNOSIS — L814 Other melanin hyperpigmentation: Secondary | ICD-10-CM | POA: Diagnosis not present

## 2019-04-19 DIAGNOSIS — D0471 Carcinoma in situ of skin of right lower limb, including hip: Secondary | ICD-10-CM | POA: Diagnosis not present

## 2019-04-19 DIAGNOSIS — D225 Melanocytic nevi of trunk: Secondary | ICD-10-CM | POA: Diagnosis not present

## 2019-04-19 DIAGNOSIS — Z23 Encounter for immunization: Secondary | ICD-10-CM | POA: Diagnosis not present

## 2019-04-19 DIAGNOSIS — D485 Neoplasm of uncertain behavior of skin: Secondary | ICD-10-CM | POA: Diagnosis not present

## 2019-04-20 ENCOUNTER — Other Ambulatory Visit: Payer: Self-pay | Admitting: Family Medicine

## 2019-04-20 NOTE — Telephone Encounter (Signed)
Ok to refill 

## 2019-04-20 NOTE — Telephone Encounter (Signed)
Pt needs OV, due for labs, will give 1 refill

## 2019-04-21 NOTE — Telephone Encounter (Signed)
Letter sent.

## 2019-05-06 ENCOUNTER — Encounter: Payer: Medicare Other | Admitting: Family Medicine

## 2019-05-10 ENCOUNTER — Other Ambulatory Visit: Payer: Self-pay

## 2019-05-11 ENCOUNTER — Ambulatory Visit (INDEPENDENT_AMBULATORY_CARE_PROVIDER_SITE_OTHER): Payer: Medicare Other | Admitting: Family Medicine

## 2019-05-11 ENCOUNTER — Encounter: Payer: Self-pay | Admitting: Family Medicine

## 2019-05-11 VITALS — BP 130/64 | HR 82 | Temp 97.6°F | Resp 14 | Ht 61.5 in | Wt 145.0 lb

## 2019-05-11 DIAGNOSIS — Z23 Encounter for immunization: Secondary | ICD-10-CM

## 2019-05-11 DIAGNOSIS — Z1159 Encounter for screening for other viral diseases: Secondary | ICD-10-CM

## 2019-05-11 DIAGNOSIS — I10 Essential (primary) hypertension: Secondary | ICD-10-CM | POA: Diagnosis not present

## 2019-05-11 DIAGNOSIS — E7800 Pure hypercholesterolemia, unspecified: Secondary | ICD-10-CM

## 2019-05-11 DIAGNOSIS — Z780 Asymptomatic menopausal state: Secondary | ICD-10-CM | POA: Diagnosis not present

## 2019-05-11 DIAGNOSIS — Z1231 Encounter for screening mammogram for malignant neoplasm of breast: Secondary | ICD-10-CM

## 2019-05-11 DIAGNOSIS — R0989 Other specified symptoms and signs involving the circulatory and respiratory systems: Secondary | ICD-10-CM

## 2019-05-11 NOTE — Progress Notes (Signed)
Subjective:    Patient ID: Cheryl Skinner, female    DOB: Mar 10, 1946, 73 y.o.   MRN: [id number]  HPI Patient is a very pleasant 73 year old Caucasian female here today for a checkup.  Her mammogram is due in February.  She is overdue for a bone density test.  We discussed this today and she would like me to schedule these for her.  She is also due for a flu shot which she agrees to.  She is overdue for hepatitis C screening which she consents to today as well.  She has a history of hypertension and hyperlipidemia for which she takes a statin along with lisinopril hydrochlorothiazide.  Her blood pressure today is well controlled at 130/64.  She denies any chest pain shortness of breath or palpitations.  However on her exam today she has a regularly irregular rhythm consistent with a PVC.  She is in normal sinus rhythm and about every seventh or 8 heartbeats she will have a PVC.  The patient does not perceive this.  She denies any lightheadedness.  Also on her exam today she has a right-sided carotid bruit that is very faint.  She denies any lightheadedness or syncope or near syncope or TIA-like symptoms. Past Medical History:  Diagnosis Date  . Hyperlipidemia   . Hypertension   . TMJ (temporomandibular joint syndrome)    Past Surgical History:  Procedure Laterality Date  . ABDOMINAL HYSTERECTOMY     Current Outpatient Medications on File Prior to Visit  Medication Sig Dispense Refill  . albuterol (PROVENTIL HFA;VENTOLIN HFA) 108 (90 Base) MCG/ACT inhaler Inhale 2 puffs into the lungs every 4 (four) hours as needed for wheezing or shortness of breath. 1 Inhaler 0  . atorvastatin (LIPITOR) 40 MG tablet Take 1 tablet (40 mg total) by mouth daily. 90 tablet 0  . cyclobenzaprine (FLEXERIL) 10 MG tablet Take 1 tablet by mouth three times daily as needed 30 tablet 0  . diclofenac (VOLTAREN) 75 MG EC tablet Take 1 tablet by mouth twice daily as needed 60 tablet 0  . fluticasone (FLONASE) 50 MCG/ACT  nasal spray Place 2 sprays into both nostrils daily. 16 g 0  . lisinopril-hydrochlorothiazide (ZESTORETIC) 20-12.5 MG tablet Take 2 tablets by mouth daily. 180 tablet 0  . meclizine (ANTIVERT) 25 MG tablet Take 1 tablet (25 mg total) by mouth 3 (three) times daily as needed for dizziness. 30 tablet 1   No current facility-administered medications on file prior to visit.   Allergies  Allergen Reactions  . Ampicillin     REACTION: makes sick  . Codeine     REACTION: loses memory  . Hydrocodone     REACTION: makes sick  . Minocycline Hcl     REACTION: makes sick  . Nitrofurantoin     REACTION: makes sick  . Oxycodone Hcl     REACTION: loses memory  . Propoxyphene Hcl     REACTION: makes sick  . Propoxyphene N-Acetaminophen     REACTION: makes sick   Social History   Socioeconomic History  . Marital status: Married    Spouse name: Not on file  . Number of children: Not on file  . Years of education: Not on file  . Highest education level: Not on file  Occupational History  . Not on file  Tobacco Use  . Smoking status: Former Smoker    Types: Cigarettes  . Smokeless tobacco: Never Used  Substance and Sexual Activity  . Alcohol use: Yes  Comment: Rare  . Drug use: No  . Sexual activity: Not on file  Other Topics Concern  . Not on file  Social History Narrative  . Not on file   Social Determinants of Health   Financial Resource Strain:   . Difficulty of Paying Living Expenses: Not on file  Food Insecurity:   . Worried About Charity fundraiser in the Last Year: Not on file  . Ran Out of Food in the Last Year: Not on file  Transportation Needs:   . Lack of Transportation (Medical): Not on file  . Lack of Transportation (Non-Medical): Not on file  Physical Activity:   . Days of Exercise per Week: Not on file  . Minutes of Exercise per Session: Not on file  Stress:   . Feeling of Stress : Not on file  Social Connections:   . Frequency of Communication with  Friends and Family: Not on file  . Frequency of Social Gatherings with Friends and Family: Not on file  . Attends Religious Services: Not on file  . Active Member of Clubs or Organizations: Not on file  . Attends Archivist Meetings: Not on file  . Marital Status: Not on file  Intimate Partner Violence:   . Fear of Current or Ex-Partner: Not on file  . Emotionally Abused: Not on file  . Physically Abused: Not on file  . Sexually Abused: Not on file     Review of Systems  All other systems reviewed and are negative.      Objective:   Physical Exam Constitutional:      General: She is not in acute distress.    Appearance: Normal appearance. She is normal weight. She is not ill-appearing or toxic-appearing.  Neck:     Vascular: Carotid bruit present.  Cardiovascular:     Rate and Rhythm: Normal rate. Rhythm irregular.     Pulses: Normal pulses.     Heart sounds: Normal heart sounds. No murmur. No gallop.   Pulmonary:     Effort: Pulmonary effort is normal. No respiratory distress.     Breath sounds: Normal breath sounds. No stridor. No wheezing, rhonchi or rales.  Abdominal:     General: Abdomen is flat. Bowel sounds are normal. There is no distension.     Palpations: Abdomen is soft.     Tenderness: There is no abdominal tenderness. There is no guarding.  Musculoskeletal:     Right lower leg: No edema.     Left lower leg: No edema.  Neurological:     Mental Status: She is alert.           Assessment & Plan:  Pure hypercholesterolemia - Plan: CBC with Differential, COMPLETE METABOLIC PANEL WITH GFR, Lipid Panel  Essential hypertension - Plan: CBC with Differential, COMPLETE METABOLIC PANEL WITH GFR, Lipid Panel  Postmenopausal estrogen deficiency - Plan: DG Bone Density  Breast cancer screening by mammogram - Plan: MM Digital Screening  Encounter for hepatitis C screening test for low risk patient - Plan: Hepatitis C Antibody  Right carotid bruit -  Plan: US Carotid Duplex Bilateral  Patient received her flu shot today.  She declined her tetanus shot.  We will schedule the patient for hepatitis C screening.  I will also schedule the patient for a mammogram as a bone density test.  Her blood pressure today is well controlled.  However I am concerned by the right-sided bruit I believe I am hearing on her exam.  I  recommended that she start an aspirin 81 mg a day and I will schedule the patient for a carotid Dopplers to evaluate further to confirm or reject the present of carotid artery stenosis.  Otherwise the patient's preventative care is up-to-date.  Routine anticipatory guidance is provided.

## 2019-05-11 NOTE — Addendum Note (Signed)
Addended by: Shary Decamp B on: 05/11/2019 11:52 AM   Modules accepted: Orders

## 2019-05-12 LAB — COMPLETE METABOLIC PANEL WITH GFR
AG Ratio: 1.6 (calc) (ref 1.0–2.5)
ALT: 25 U/L (ref 6–29)
AST: 22 U/L (ref 10–35)
Albumin: 3.9 g/dL (ref 3.6–5.1)
Alkaline phosphatase (APISO): 51 U/L (ref 37–153)
BUN/Creatinine Ratio: 27 (calc) — ABNORMAL HIGH (ref 6–22)
BUN: 27 mg/dL — ABNORMAL HIGH (ref 7–25)
CO2: 26 mmol/L (ref 20–32)
Calcium: 9.3 mg/dL (ref 8.6–10.4)
Chloride: 105 mmol/L (ref 98–110)
Creat: 1.01 mg/dL — ABNORMAL HIGH (ref 0.60–0.93)
GFR, Est African American: 63 mL/min/{1.73_m2} (ref >=60)
GFR, Est Non African American: 54 mL/min/{1.73_m2} — ABNORMAL LOW (ref >=60)
Globulin: 2.4 g/dL (calc) (ref 1.9–3.7)
Glucose, Bld: 91 mg/dL (ref 65–99)
Potassium: 4 mmol/L (ref 3.5–5.3)
Sodium: 143 mmol/L (ref 135–146)
Total Bilirubin: 0.4 mg/dL (ref 0.2–1.2)
Total Protein: 6.3 g/dL (ref 6.1–8.1)

## 2019-05-12 LAB — CBC WITH DIFFERENTIAL/PLATELET
Absolute Monocytes: 504 cells/uL (ref 200–950)
Basophils Absolute: 82 cells/uL (ref 0–200)
Basophils Relative: 1.3 %
Eosinophils Absolute: 139 cells/uL (ref 15–500)
Eosinophils Relative: 2.2 %
HCT: 40.8 % (ref 35.0–45.0)
Hemoglobin: 13.9 g/dL (ref 11.7–15.5)
Lymphs Abs: 2281 cells/uL (ref 850–3900)
MCH: 30.8 pg (ref 27.0–33.0)
MCHC: 34.1 g/dL (ref 32.0–36.0)
MCV: 90.5 fL (ref 80.0–100.0)
MPV: 11.8 fL (ref 7.5–12.5)
Monocytes Relative: 8 %
Neutro Abs: 3295 cells/uL (ref 1500–7800)
Neutrophils Relative %: 52.3 %
Platelets: 262 10*3/uL (ref 140–400)
RBC: 4.51 10*6/uL (ref 3.80–5.10)
RDW: 12.2 % (ref 11.0–15.0)
Total Lymphocyte: 36.2 %
WBC: 6.3 10*3/uL (ref 3.8–10.8)

## 2019-05-12 LAB — LIPID PANEL
Cholesterol: 155 mg/dL (ref <200)
HDL: 46 mg/dL — ABNORMAL LOW (ref >=50)
LDL Cholesterol (Calc): 88 mg/dL (calc)
Non-HDL Cholesterol (Calc): 109 mg/dL (calc) (ref <130)
Total CHOL/HDL Ratio: 3.4 (calc) (ref <5.0)
Triglycerides: 113 mg/dL (ref <150)

## 2019-05-12 LAB — HEPATITIS C ANTIBODY
Hepatitis C Ab: NONREACTIVE
SIGNAL TO CUT-OFF: 0.01 (ref <1.00)

## 2019-05-13 ENCOUNTER — Other Ambulatory Visit: Payer: Self-pay

## 2019-05-13 NOTE — Patient Outreach (Signed)
[facility]) Care Management  05/13/2019  Cheryl Skinner 29-Jun-1945 [id number]   Medication Adherence call to [name] Telephone call to Patient regarding Medication Adherence unable to reach patient. [name] did not answer and voice mail is full,patient is showing past due on Atorvastatin 40 mg under [place].   [place] Management Direct Dial [phone]  Fax [phone] [name]

## 2019-05-18 ENCOUNTER — Encounter: Payer: Self-pay | Admitting: *Deleted

## 2019-05-26 ENCOUNTER — Other Ambulatory Visit: Payer: Self-pay | Admitting: Family Medicine

## 2019-06-10 ENCOUNTER — Other Ambulatory Visit: Payer: Self-pay

## 2019-06-10 ENCOUNTER — Ambulatory Visit (INDEPENDENT_AMBULATORY_CARE_PROVIDER_SITE_OTHER): Payer: Medicare Other | Admitting: Family Medicine

## 2019-06-10 DIAGNOSIS — J069 Acute upper respiratory infection, unspecified: Secondary | ICD-10-CM | POA: Diagnosis not present

## 2019-06-10 NOTE — Progress Notes (Signed)
Subjective:    Patient ID: Cheryl Skinner, female    DOB: 06/11/1945, 74 y.o.   MRN: [id number]  HPI Patient is being seen today as a telephone visit.  She consents to be seen via telephone.  Phone call began at 920.  Phone call concluded at 930.  Patient states symptoms began on Tuesday.  Symptoms include subjective fevers off and on all day long.  Temperature is averaging around 99.5.  She also reports diffuse body aches.  She has a cough but no shortness of breath.  The cough is nonproductive.  She does have some head congestion.  She denies any sore throat or sinus pain.  She denies any loss in her sense of smell or taste.  She denies any nausea or vomiting.  She denies any pleurisy or hemoptysis. Past Medical History:  Diagnosis Date  . Hyperlipidemia   . Hypertension   . TMJ (temporomandibular joint syndrome)    Past Surgical History:  Procedure Laterality Date  . ABDOMINAL HYSTERECTOMY     Current Outpatient Medications on File Prior to Visit  Medication Sig Dispense Refill  . albuterol (PROVENTIL HFA;VENTOLIN HFA) 108 (90 Base) MCG/ACT inhaler Inhale 2 puffs into the lungs every 4 (four) hours as needed for wheezing or shortness of breath. 1 Inhaler 0  . atorvastatin (LIPITOR) 40 MG tablet Take 1 tablet by mouth once daily 90 tablet 0  . cyclobenzaprine (FLEXERIL) 10 MG tablet Take 1 tablet by mouth three times daily as needed 30 tablet 0  . diclofenac (VOLTAREN) 75 MG EC tablet Take 1 tablet by mouth twice daily as needed 60 tablet 0  . fluticasone (FLONASE) 50 MCG/ACT nasal spray Place 2 sprays into both nostrils daily. 16 g 0  . lisinopril-hydrochlorothiazide (ZESTORETIC) 20-12.5 MG tablet Take 2 tablets by mouth once daily 180 tablet 0  . meclizine (ANTIVERT) 25 MG tablet Take 1 tablet (25 mg total) by mouth 3 (three) times daily as needed for dizziness. 30 tablet 1   No current facility-administered medications on file prior to visit.   Allergies  Allergen Reactions  .  Ampicillin     REACTION: makes sick  . Codeine     REACTION: loses memory  . Hydrocodone     REACTION: makes sick  . Minocycline Hcl     REACTION: makes sick  . Nitrofurantoin     REACTION: makes sick  . Oxycodone Hcl     REACTION: loses memory  . Propoxyphene Hcl     REACTION: makes sick  . Propoxyphene N-Acetaminophen     REACTION: makes sick   Social History   Socioeconomic History  . Marital status: Married    Spouse name: Not on file  . Number of children: Not on file  . Years of education: Not on file  . Highest education level: Not on file  Occupational History  . Not on file  Tobacco Use  . Smoking status: Former Smoker    Types: Cigarettes  . Smokeless tobacco: Never Used  Substance and Sexual Activity  . Alcohol use: Yes    Comment: Rare  . Drug use: No  . Sexual activity: Not on file  Other Topics Concern  . Not on file  Social History Narrative  . Not on file   Social Determinants of Health   Financial Resource Strain:   . Difficulty of Paying Living Expenses: Not on file  Food Insecurity:   . Worried About Charity fundraiser in the Last Year:  Not on file  . Ran Out of Food in the Last Year: Not on file  Transportation Needs:   . Lack of Transportation (Medical): Not on file  . Lack of Transportation (Non-Medical): Not on file  Physical Activity:   . Days of Exercise per Week: Not on file  . Minutes of Exercise per Session: Not on file  Stress:   . Feeling of Stress : Not on file  Social Connections:   . Frequency of Communication with Friends and Family: Not on file  . Frequency of Social Gatherings with Friends and Family: Not on file  . Attends Religious Services: Not on file  . Active Member of Clubs or Organizations: Not on file  . Attends Archivist Meetings: Not on file  . Marital Status: Not on file  Intimate Partner Violence:   . Fear of Current or Ex-Partner: Not on file  . Emotionally Abused: Not on file  . Physically  Abused: Not on file  . Sexually Abused: Not on file      Review of Systems  All other systems reviewed and are negative.      Objective:   Physical Exam        Assessment & Plan:  Symptoms sound consistent with a viral upper respiratory infection.  I have recommended that she come to my office today so that I can test her for COVID-19.  I have recommended symptomatic care including Tylenol and/or ibuprofen for body aches and fevers, Mucinex for cough, and quarantine until test results return.  If the patient develop any shortness of breath, she is directed to go to the emergency room for further evaluation but otherwise it sounds like she is doing relatively well.

## 2019-06-12 LAB — SARS-COV-2 RNA,(COVID-19) QUALITATIVE NAAT: SARS CoV2 RNA: DETECTED — AB

## 2019-06-29 ENCOUNTER — Encounter: Payer: Medicare Other | Admitting: Family Medicine

## 2019-07-06 ENCOUNTER — Other Ambulatory Visit: Payer: Self-pay | Admitting: Family Medicine

## 2019-07-06 NOTE — Telephone Encounter (Signed)
Ok to refill 

## 2019-07-28 DIAGNOSIS — M85852 Other specified disorders of bone density and structure, left thigh: Secondary | ICD-10-CM | POA: Diagnosis not present

## 2019-07-28 DIAGNOSIS — Z1231 Encounter for screening mammogram for malignant neoplasm of breast: Secondary | ICD-10-CM | POA: Diagnosis not present

## 2019-07-28 DIAGNOSIS — M85851 Other specified disorders of bone density and structure, right thigh: Secondary | ICD-10-CM | POA: Diagnosis not present

## 2019-07-28 LAB — HM MAMMOGRAPHY

## 2019-08-04 ENCOUNTER — Other Ambulatory Visit: Payer: Self-pay | Admitting: Family Medicine

## 2019-08-04 NOTE — Telephone Encounter (Signed)
Ok to refill 

## 2019-08-17 ENCOUNTER — Encounter: Payer: Self-pay | Admitting: Family Medicine

## 2019-08-17 DIAGNOSIS — M8580 Other specified disorders of bone density and structure, unspecified site: Secondary | ICD-10-CM | POA: Insufficient documentation

## 2019-08-19 ENCOUNTER — Encounter: Payer: Self-pay | Admitting: *Deleted

## 2019-08-20 ENCOUNTER — Other Ambulatory Visit: Payer: Self-pay

## 2019-08-20 NOTE — Patient Outreach (Signed)
[facility]) Care Management  08/20/2019  OLITA COMEAUX March 29, 1946 [id number]   Medication Adherence call to Mr. Zakyla Mckeone HIPPA Compliant Voice message left with a call back number. Mrs. Parris is showing past due on Liisnopril/Hctz 20/12.5 mg under [place].   [place] Management Direct Dial [phone]  Fax [phone] [email or web address]

## 2019-08-25 ENCOUNTER — Telehealth: Payer: Self-pay | Admitting: Family Medicine

## 2019-08-25 MED ORDER — ALENDRONATE SODIUM 70 MG PO TABS: 70.0000 mg | ORAL_TABLET | ORAL | 3 refills | Status: DC

## 2019-08-25 NOTE — Telephone Encounter (Signed)
Pt had bone density done at [facility]. Per Dr. 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 Recommendations are to start Fosamax 70mg  weekly and calcium 1200mg  along with Vit D 1000 ius daily.   Pt aware and med sent to [facility]

## 2019-08-26 ENCOUNTER — Other Ambulatory Visit: Payer: Self-pay | Admitting: Family Medicine

## 2019-09-01 ENCOUNTER — Telehealth: Payer: Self-pay | Admitting: Family Medicine

## 2019-09-01 NOTE — Telephone Encounter (Signed)
Pt did acquire the fosamax and she has concerns about taking it as it has a huge side effect profile. She states that it says not to take asa with it and you had put her on asa. It also says not to take NSAIDs with it and she takes diclofenac for her TMJ. She is extremely worried about the SE to this medication and would like your thought on it before she starts it. She also asked - Would it be ok if she just stays on the Calcium and vitamin d?

## 2019-09-02 NOTE — Telephone Encounter (Signed)
Pt aware of provider recommendations

## 2019-09-02 NOTE — Telephone Encounter (Signed)
Ok to try calcium and vit d first and recheck dexa in 2 years for progression.

## 2019-09-03 ENCOUNTER — Other Ambulatory Visit: Payer: Self-pay | Admitting: Family Medicine

## 2019-09-03 NOTE — Telephone Encounter (Signed)
Requesting refill    Flexeril  LOV: 06/10/2019  LRF:  08/05/2019

## 2019-09-23 ENCOUNTER — Other Ambulatory Visit: Payer: Self-pay | Admitting: Family Medicine

## 2019-10-08 ENCOUNTER — Other Ambulatory Visit: Payer: Self-pay | Admitting: Family Medicine

## 2019-10-11 ENCOUNTER — Other Ambulatory Visit: Payer: Self-pay | Admitting: Family Medicine

## 2019-10-11 NOTE — Telephone Encounter (Signed)
Ok to refill 

## 2019-11-11 ENCOUNTER — Other Ambulatory Visit: Payer: Self-pay | Admitting: Family Medicine

## 2019-11-11 NOTE — Telephone Encounter (Signed)
Last refilled: 10/12/2019 Last office visit: 05/11/2019

## 2019-11-21 ENCOUNTER — Other Ambulatory Visit: Payer: Self-pay | Admitting: Family Medicine

## 2019-11-22 ENCOUNTER — Other Ambulatory Visit: Payer: Self-pay

## 2019-11-22 MED ORDER — DICLOFENAC SODIUM 75 MG PO TBEC: 75.0000 mg | DELAYED_RELEASE_TABLET | Freq: Two times a day (BID) | ORAL | 0 refills | Status: DC | PRN

## 2019-12-16 ENCOUNTER — Other Ambulatory Visit: Payer: Self-pay | Admitting: Family Medicine

## 2019-12-16 NOTE — Telephone Encounter (Signed)
Ok to refill 

## 2020-01-10 ENCOUNTER — Other Ambulatory Visit: Payer: Self-pay | Admitting: Family Medicine

## 2020-02-07 ENCOUNTER — Other Ambulatory Visit: Payer: Self-pay | Admitting: Family Medicine

## 2020-02-07 NOTE — Telephone Encounter (Signed)
Ok to refill 

## 2020-02-24 ENCOUNTER — Other Ambulatory Visit: Payer: Self-pay | Admitting: Family Medicine

## 2020-03-08 ENCOUNTER — Other Ambulatory Visit: Payer: Self-pay | Admitting: Family Medicine

## 2020-03-08 DIAGNOSIS — M26609 Unspecified temporomandibular joint disorder, unspecified side: Secondary | ICD-10-CM

## 2020-03-08 NOTE — Telephone Encounter (Signed)
Last office visit - 06/10/19 Last refilled -  02/07/20

## 2020-03-30 ENCOUNTER — Other Ambulatory Visit: Payer: Self-pay | Admitting: Family Medicine

## 2020-04-17 ENCOUNTER — Other Ambulatory Visit: Payer: Self-pay | Admitting: Family Medicine

## 2020-04-17 DIAGNOSIS — M26609 Unspecified temporomandibular joint disorder, unspecified side: Secondary | ICD-10-CM

## 2020-05-10 DIAGNOSIS — L820 Inflamed seborrheic keratosis: Secondary | ICD-10-CM | POA: Diagnosis not present

## 2020-05-10 DIAGNOSIS — L570 Actinic keratosis: Secondary | ICD-10-CM | POA: Diagnosis not present

## 2020-05-10 DIAGNOSIS — D225 Melanocytic nevi of trunk: Secondary | ICD-10-CM | POA: Diagnosis not present

## 2020-05-10 DIAGNOSIS — Z85828 Personal history of other malignant neoplasm of skin: Secondary | ICD-10-CM | POA: Diagnosis not present

## 2020-05-10 DIAGNOSIS — L821 Other seborrheic keratosis: Secondary | ICD-10-CM | POA: Diagnosis not present

## 2020-05-10 DIAGNOSIS — L814 Other melanin hyperpigmentation: Secondary | ICD-10-CM | POA: Diagnosis not present

## 2020-05-29 ENCOUNTER — Other Ambulatory Visit: Payer: Self-pay | Admitting: Family Medicine

## 2020-06-06 ENCOUNTER — Other Ambulatory Visit: Payer: Self-pay | Admitting: Family Medicine

## 2020-07-08 DIAGNOSIS — M7912 Myalgia of auxiliary muscles, head and neck: Secondary | ICD-10-CM | POA: Diagnosis not present

## 2020-07-08 DIAGNOSIS — M9900 Segmental and somatic dysfunction of head region: Secondary | ICD-10-CM | POA: Diagnosis not present

## 2020-07-08 DIAGNOSIS — M5117 Intervertebral disc disorders with radiculopathy, lumbosacral region: Secondary | ICD-10-CM | POA: Diagnosis not present

## 2020-07-08 DIAGNOSIS — M9905 Segmental and somatic dysfunction of pelvic region: Secondary | ICD-10-CM | POA: Diagnosis not present

## 2020-07-08 DIAGNOSIS — M9902 Segmental and somatic dysfunction of thoracic region: Secondary | ICD-10-CM | POA: Diagnosis not present

## 2020-07-12 DIAGNOSIS — M9902 Segmental and somatic dysfunction of thoracic region: Secondary | ICD-10-CM | POA: Diagnosis not present

## 2020-07-12 DIAGNOSIS — M9900 Segmental and somatic dysfunction of head region: Secondary | ICD-10-CM | POA: Diagnosis not present

## 2020-07-12 DIAGNOSIS — M9905 Segmental and somatic dysfunction of pelvic region: Secondary | ICD-10-CM | POA: Diagnosis not present

## 2020-07-12 DIAGNOSIS — M5117 Intervertebral disc disorders with radiculopathy, lumbosacral region: Secondary | ICD-10-CM | POA: Diagnosis not present

## 2020-07-12 DIAGNOSIS — M9901 Segmental and somatic dysfunction of cervical region: Secondary | ICD-10-CM | POA: Diagnosis not present

## 2020-07-12 DIAGNOSIS — M7912 Myalgia of auxiliary muscles, head and neck: Secondary | ICD-10-CM | POA: Diagnosis not present

## 2020-07-19 DIAGNOSIS — M9901 Segmental and somatic dysfunction of cervical region: Secondary | ICD-10-CM | POA: Diagnosis not present

## 2020-07-19 DIAGNOSIS — M5117 Intervertebral disc disorders with radiculopathy, lumbosacral region: Secondary | ICD-10-CM | POA: Diagnosis not present

## 2020-07-19 DIAGNOSIS — M9905 Segmental and somatic dysfunction of pelvic region: Secondary | ICD-10-CM | POA: Diagnosis not present

## 2020-07-19 DIAGNOSIS — M9900 Segmental and somatic dysfunction of head region: Secondary | ICD-10-CM | POA: Diagnosis not present

## 2020-07-19 DIAGNOSIS — M9902 Segmental and somatic dysfunction of thoracic region: Secondary | ICD-10-CM | POA: Diagnosis not present

## 2020-07-19 DIAGNOSIS — M7912 Myalgia of auxiliary muscles, head and neck: Secondary | ICD-10-CM | POA: Diagnosis not present

## 2020-07-21 DIAGNOSIS — M9901 Segmental and somatic dysfunction of cervical region: Secondary | ICD-10-CM | POA: Diagnosis not present

## 2020-07-21 DIAGNOSIS — M7912 Myalgia of auxiliary muscles, head and neck: Secondary | ICD-10-CM | POA: Diagnosis not present

## 2020-07-21 DIAGNOSIS — M9902 Segmental and somatic dysfunction of thoracic region: Secondary | ICD-10-CM | POA: Diagnosis not present

## 2020-07-21 DIAGNOSIS — M5117 Intervertebral disc disorders with radiculopathy, lumbosacral region: Secondary | ICD-10-CM | POA: Diagnosis not present

## 2020-07-21 DIAGNOSIS — M9905 Segmental and somatic dysfunction of pelvic region: Secondary | ICD-10-CM | POA: Diagnosis not present

## 2020-07-21 DIAGNOSIS — M9900 Segmental and somatic dysfunction of head region: Secondary | ICD-10-CM | POA: Diagnosis not present

## 2020-07-26 DIAGNOSIS — M7912 Myalgia of auxiliary muscles, head and neck: Secondary | ICD-10-CM | POA: Diagnosis not present

## 2020-07-26 DIAGNOSIS — M9905 Segmental and somatic dysfunction of pelvic region: Secondary | ICD-10-CM | POA: Diagnosis not present

## 2020-07-26 DIAGNOSIS — M5117 Intervertebral disc disorders with radiculopathy, lumbosacral region: Secondary | ICD-10-CM | POA: Diagnosis not present

## 2020-07-26 DIAGNOSIS — M9900 Segmental and somatic dysfunction of head region: Secondary | ICD-10-CM | POA: Diagnosis not present

## 2020-07-26 DIAGNOSIS — M9902 Segmental and somatic dysfunction of thoracic region: Secondary | ICD-10-CM | POA: Diagnosis not present

## 2020-07-26 DIAGNOSIS — M9901 Segmental and somatic dysfunction of cervical region: Secondary | ICD-10-CM | POA: Diagnosis not present

## 2020-08-02 ENCOUNTER — Encounter: Payer: Self-pay | Admitting: Family Medicine

## 2020-08-02 DIAGNOSIS — M7912 Myalgia of auxiliary muscles, head and neck: Secondary | ICD-10-CM | POA: Diagnosis not present

## 2020-08-02 DIAGNOSIS — M9901 Segmental and somatic dysfunction of cervical region: Secondary | ICD-10-CM | POA: Diagnosis not present

## 2020-08-02 DIAGNOSIS — M5117 Intervertebral disc disorders with radiculopathy, lumbosacral region: Secondary | ICD-10-CM | POA: Diagnosis not present

## 2020-08-02 DIAGNOSIS — M9902 Segmental and somatic dysfunction of thoracic region: Secondary | ICD-10-CM | POA: Diagnosis not present

## 2020-08-02 DIAGNOSIS — M9905 Segmental and somatic dysfunction of pelvic region: Secondary | ICD-10-CM | POA: Diagnosis not present

## 2020-08-02 DIAGNOSIS — Z1231 Encounter for screening mammogram for malignant neoplasm of breast: Secondary | ICD-10-CM | POA: Diagnosis not present

## 2020-08-02 DIAGNOSIS — M9900 Segmental and somatic dysfunction of head region: Secondary | ICD-10-CM | POA: Diagnosis not present

## 2020-08-11 DIAGNOSIS — M9901 Segmental and somatic dysfunction of cervical region: Secondary | ICD-10-CM | POA: Diagnosis not present

## 2020-08-11 DIAGNOSIS — M5117 Intervertebral disc disorders with radiculopathy, lumbosacral region: Secondary | ICD-10-CM | POA: Diagnosis not present

## 2020-08-11 DIAGNOSIS — M9900 Segmental and somatic dysfunction of head region: Secondary | ICD-10-CM | POA: Diagnosis not present

## 2020-08-11 DIAGNOSIS — M9902 Segmental and somatic dysfunction of thoracic region: Secondary | ICD-10-CM | POA: Diagnosis not present

## 2020-08-11 DIAGNOSIS — M7912 Myalgia of auxiliary muscles, head and neck: Secondary | ICD-10-CM | POA: Diagnosis not present

## 2020-08-11 DIAGNOSIS — M9905 Segmental and somatic dysfunction of pelvic region: Secondary | ICD-10-CM | POA: Diagnosis not present

## 2020-08-16 DIAGNOSIS — M9902 Segmental and somatic dysfunction of thoracic region: Secondary | ICD-10-CM | POA: Diagnosis not present

## 2020-08-16 DIAGNOSIS — M5117 Intervertebral disc disorders with radiculopathy, lumbosacral region: Secondary | ICD-10-CM | POA: Diagnosis not present

## 2020-08-16 DIAGNOSIS — M9900 Segmental and somatic dysfunction of head region: Secondary | ICD-10-CM | POA: Diagnosis not present

## 2020-08-16 DIAGNOSIS — M7912 Myalgia of auxiliary muscles, head and neck: Secondary | ICD-10-CM | POA: Diagnosis not present

## 2020-08-16 DIAGNOSIS — M9901 Segmental and somatic dysfunction of cervical region: Secondary | ICD-10-CM | POA: Diagnosis not present

## 2020-08-16 DIAGNOSIS — M9905 Segmental and somatic dysfunction of pelvic region: Secondary | ICD-10-CM | POA: Diagnosis not present

## 2020-08-28 ENCOUNTER — Other Ambulatory Visit: Payer: Self-pay | Admitting: Family Medicine

## 2020-08-28 DIAGNOSIS — M26609 Unspecified temporomandibular joint disorder, unspecified side: Secondary | ICD-10-CM

## 2020-08-29 ENCOUNTER — Other Ambulatory Visit: Payer: Self-pay | Admitting: Family Medicine

## 2020-09-13 DIAGNOSIS — M9901 Segmental and somatic dysfunction of cervical region: Secondary | ICD-10-CM | POA: Diagnosis not present

## 2020-09-13 DIAGNOSIS — M9905 Segmental and somatic dysfunction of pelvic region: Secondary | ICD-10-CM | POA: Diagnosis not present

## 2020-09-13 DIAGNOSIS — M5117 Intervertebral disc disorders with radiculopathy, lumbosacral region: Secondary | ICD-10-CM | POA: Diagnosis not present

## 2020-09-13 DIAGNOSIS — M7912 Myalgia of auxiliary muscles, head and neck: Secondary | ICD-10-CM | POA: Diagnosis not present

## 2020-09-13 DIAGNOSIS — M9900 Segmental and somatic dysfunction of head region: Secondary | ICD-10-CM | POA: Diagnosis not present

## 2020-09-13 DIAGNOSIS — M9902 Segmental and somatic dysfunction of thoracic region: Secondary | ICD-10-CM | POA: Diagnosis not present

## 2020-09-27 DIAGNOSIS — M9905 Segmental and somatic dysfunction of pelvic region: Secondary | ICD-10-CM | POA: Diagnosis not present

## 2020-09-27 DIAGNOSIS — M9901 Segmental and somatic dysfunction of cervical region: Secondary | ICD-10-CM | POA: Diagnosis not present

## 2020-09-27 DIAGNOSIS — M9902 Segmental and somatic dysfunction of thoracic region: Secondary | ICD-10-CM | POA: Diagnosis not present

## 2020-09-27 DIAGNOSIS — M9900 Segmental and somatic dysfunction of head region: Secondary | ICD-10-CM | POA: Diagnosis not present

## 2020-09-27 DIAGNOSIS — M7912 Myalgia of auxiliary muscles, head and neck: Secondary | ICD-10-CM | POA: Diagnosis not present

## 2020-09-27 DIAGNOSIS — M5117 Intervertebral disc disorders with radiculopathy, lumbosacral region: Secondary | ICD-10-CM | POA: Diagnosis not present

## 2020-10-03 ENCOUNTER — Other Ambulatory Visit: Payer: Self-pay | Admitting: Family Medicine

## 2020-10-03 DIAGNOSIS — M26609 Unspecified temporomandibular joint disorder, unspecified side: Secondary | ICD-10-CM

## 2020-10-26 ENCOUNTER — Encounter (HOSPITAL_BASED_OUTPATIENT_CLINIC_OR_DEPARTMENT_OTHER): Payer: Self-pay | Admitting: Obstetrics and Gynecology

## 2020-10-26 ENCOUNTER — Other Ambulatory Visit: Payer: Self-pay

## 2020-10-26 ENCOUNTER — Emergency Department (HOSPITAL_BASED_OUTPATIENT_CLINIC_OR_DEPARTMENT_OTHER): Payer: Medicare Other | Admitting: Radiology

## 2020-10-26 ENCOUNTER — Emergency Department (HOSPITAL_BASED_OUTPATIENT_CLINIC_OR_DEPARTMENT_OTHER)
Admission: EM | Admit: 2020-10-26 | Discharge: 2020-10-26 | Disposition: A | Discharge status: Home / Self Care | Payer: Medicare Other | Attending: Emergency Medicine | Admitting: Emergency Medicine

## 2020-10-26 DIAGNOSIS — W010XXA Fall on same level from slipping, tripping and stumbling without subsequent striking against object, initial encounter: Secondary | ICD-10-CM | POA: Diagnosis not present

## 2020-10-26 DIAGNOSIS — S79922A Unspecified injury of left thigh, initial encounter: Secondary | ICD-10-CM | POA: Diagnosis present

## 2020-10-26 DIAGNOSIS — Z87891 Personal history of nicotine dependence: Secondary | ICD-10-CM | POA: Insufficient documentation

## 2020-10-26 DIAGNOSIS — Z79899 Other long term (current) drug therapy: Secondary | ICD-10-CM | POA: Diagnosis not present

## 2020-10-26 DIAGNOSIS — S76312A Strain of muscle, fascia and tendon of the posterior muscle group at thigh level, left thigh, initial encounter: Secondary | ICD-10-CM | POA: Insufficient documentation

## 2020-10-26 DIAGNOSIS — I10 Essential (primary) hypertension: Secondary | ICD-10-CM | POA: Insufficient documentation

## 2020-10-26 DIAGNOSIS — Y92511 Restaurant or cafe as the place of occurrence of the external cause: Secondary | ICD-10-CM | POA: Diagnosis not present

## 2020-10-26 DIAGNOSIS — M25552 Pain in left hip: Secondary | ICD-10-CM | POA: Diagnosis not present

## 2020-10-26 DIAGNOSIS — Y9301 Activity, walking, marching and hiking: Secondary | ICD-10-CM | POA: Insufficient documentation

## 2020-10-26 MED ORDER — IBUPROFEN 400 MG PO TABS
600.0000 mg | ORAL_TABLET | Freq: Once | ORAL | Status: AC
  Administered 2020-10-26: 600 mg via ORAL
  Filled 2020-10-26: qty 1

## 2020-10-26 MED ORDER — NAPROXEN 375 MG PO TABS: 375.0000 mg | ORAL_TABLET | Freq: Two times a day (BID) | ORAL | 0 refills | Status: DC

## 2020-10-26 NOTE — ED Triage Notes (Signed)
Patient reports left hip and leg pain following a slip and fall from the rain. Patient reports she did not hit her head and did not pass out.  Patient denies blood thinners.

## 2020-10-26 NOTE — Discharge Instructions (Addendum)
Apply ice to help with swelling.  Use your crutches at home for support.  Take the medications as needed for pain.  Considering following up with your primary care doctor for sports medicine if the symptoms or not improving in the next week

## 2020-10-26 NOTE — ED Provider Notes (Signed)
[facility] EMERGENCY DEPT Provider Note   CSN: [id number] Arrival date & time: 10/26/20  2024     History Chief Complaint  Patient presents with  . Fall    Cheryl Skinner is a 75 y.o. female.  HPI   Patient presented to the ED for evaluation of left thigh pain.  Patient walked into a restaurant with the floor was wet.  She ended up slipping and falling.  Patient states she hyperextended her left leg.  Patient fell onto her hip.  She did not hit her head or lose consciousness.  She denies any other complaints of neck pain or back pain.  She is not on anticoagulants.  Patient is primarily having pain in her left thigh and hip area.  She was able to walk and bear weight  Past Medical History:  Diagnosis Date  . Hyperlipidemia   . Hypertension   . Osteopenia    T -2.4 in hip 2021  . TMJ (temporomandibular joint syndrome)     Patient Active Problem List   Diagnosis Date Noted  . Osteopenia   . Hyperlipidemia   . Hypertension   . TMJ (temporomandibular joint syndrome)     Past Surgical History:  Procedure Laterality Date  . ABDOMINAL HYSTERECTOMY       OB History    Gravida      Para      Term      Preterm      AB      Living  1     SAB      IAB      Ectopic      Multiple      Live Births              No family history on file.  Social History   Tobacco Use  . Smoking status: Former Smoker    Types: Cigarettes  . Smokeless tobacco: Never Used  Vaping Use  . Vaping Use: Never used  Substance Use Topics  . Alcohol use: Yes    Comment: Rare  . Drug use: No    Home Medications Prior to Admission medications   Medication Sig Start Date End Date Taking? Authorizing Provider  naproxen (NAPROSYN) 375 MG tablet Take 1 tablet (375 mg total) by mouth 2 (two) times daily with a meal. As needed for pain 10/26/20  Yes Dorie Rank, MD  albuterol (PROVENTIL HFA;VENTOLIN HFA) 108 (90 Base) MCG/ACT inhaler Inhale 2 puffs into the lungs  every 4 (four) hours as needed for wheezing or shortness of breath. 01/07/18   Delsa Grana, PA-C  alendronate (FOSAMAX) 70 MG tablet Take 1 tablet (70 mg total) by mouth every 7 (seven) days. Take with a full glass of water on an empty stomach. 08/25/19   Susy Frizzle, MD  atorvastatin (LIPITOR) 40 MG tablet Take 1 tablet by mouth once daily 08/28/20   Susy Frizzle, MD  cyclobenzaprine (FLEXERIL) 10 MG tablet Take 1 tablet by mouth three times daily as needed 10/03/20   Susy Frizzle, MD  diclofenac (VOLTAREN) 75 MG EC tablet TAKE 1 TABLET BY MOUTH TWICE DAILY AS NEEDED ( NEEDS OFFICE VISIT BEFORE ANYMORE REFILLS) 06/06/20   Susy Frizzle, MD  fluticasone (FLONASE) 50 MCG/ACT nasal spray Place 2 sprays into both nostrils daily. 01/07/18   Delsa Grana, PA-C  lisinopril-hydrochlorothiazide (ZESTORETIC) 20-12.5 MG tablet Take 2 tablets by mouth once daily 08/29/20   Susy Frizzle, MD  meclizine (ANTIVERT) 25  MG tablet Take 1 tablet (25 mg total) by mouth 3 (three) times daily as needed for dizziness. 02/19/18   Susy Frizzle, MD    Allergies    Ampicillin, Codeine, Hydrocodone, Minocycline hcl, Nitrofurantoin, Oxycodone hcl, Propoxyphene hcl, and Propoxyphene n-acetaminophen  Review of Systems   Review of Systems  All other systems reviewed and are negative.   Physical Exam Updated Vital Signs BP 133/69 (BP Location: Right Arm)   Pulse 91   Temp 99.1 F (37.3 C) (Oral)   Resp 17   Ht 1.524 m (5')   Wt 62.6 kg   SpO2 99%   BMI 26.95 kg/m   Physical Exam Vitals and nursing note reviewed.  Constitutional:      General: She is not in acute distress.    Appearance: She is well-developed.  HENT:     Head: Normocephalic and atraumatic.     Right Ear: External ear normal.     Left Ear: External ear normal.  Eyes:     General: No scleral icterus.       Right eye: No discharge.        Left eye: No discharge.     Conjunctiva/sclera: Conjunctivae normal.  Neck:      Trachea: No tracheal deviation.  Cardiovascular:     Rate and Rhythm: Normal rate.  Pulmonary:     Effort: Pulmonary effort is normal. No respiratory distress.     Breath sounds: No stridor.  Abdominal:     General: There is no distension.  Musculoskeletal:        General: Tenderness present. No swelling or deformity.     Cervical back: Neck supple.     Comments: No spinal tenderness, tenderness palpation proximal posterior thigh  Skin:    General: Skin is warm and dry.     Findings: No rash.  Neurological:     Mental Status: She is alert.     Cranial Nerves: Cranial nerve deficit: no gross deficits.     ED Results / Procedures / Treatments   Labs (all labs ordered are listed, but only abnormal results are displayed) Labs Reviewed - No data to display  EKG None  Radiology DG Hip Unilat With Pelvis 2-3 Views Left  Result Date: 10/26/2020 CLINICAL DATA:  Fall, hip pain EXAM: DG HIP (WITH OR WITHOUT PELVIS) 2-3V LEFT COMPARISON:  None. FINDINGS: Hip joints and SI joints are symmetric and unremarkable. No acute bony abnormality. Specifically, no fracture, subluxation, or dislocation. IMPRESSION: No acute bony abnormality. Electronically Signed   By: Rolm Baptise M.D.   On: 10/26/2020 21:19    Procedures Procedures   Medications Ordered in ED Medications  ibuprofen (ADVIL) tablet 600 mg (has no administration in time range)    ED Course  I have reviewed the triage vital signs and the nursing notes.  Pertinent labs & imaging results that were available during my care of the patient were reviewed by me and considered in my medical decision making (see chart for details).    MDM Rules/Calculators/A&P                          Patient's most tender of posterior aspect of her thigh.  Symptoms are suggestive of a hamstring strain.  X-ray does not show any signs of fracture.  Patient has been able to bear weight.  I doubt occult fracture.  She has crutches at home.  Will  discharge home with NSAIDs and ice.  Outpatient follow-up as needed Final Clinical Impression(s) / ED Diagnoses Final diagnoses:  Strain of left hamstring, initial encounter    Rx / DC Orders ED Discharge Orders         Ordered    naproxen (NAPROSYN) 375 MG tablet  2 times daily with meals        10/26/20 2313           Dorie Rank, MD 10/26/20 2316

## 2020-11-01 ENCOUNTER — Ambulatory Visit: Payer: Self-pay

## 2020-11-01 ENCOUNTER — Other Ambulatory Visit: Payer: Self-pay

## 2020-11-01 ENCOUNTER — Encounter: Payer: Self-pay | Admitting: Family Medicine

## 2020-11-01 ENCOUNTER — Ambulatory Visit (INDEPENDENT_AMBULATORY_CARE_PROVIDER_SITE_OTHER): Payer: Medicare Other | Admitting: Family Medicine

## 2020-11-01 VITALS — BP 138/60 | Ht 60.0 in | Wt 138.0 lb

## 2020-11-01 DIAGNOSIS — S76312A Strain of muscle, fascia and tendon of the posterior muscle group at thigh level, left thigh, initial encounter: Secondary | ICD-10-CM

## 2020-11-01 DIAGNOSIS — M79605 Pain in left leg: Secondary | ICD-10-CM

## 2020-11-01 NOTE — Patient Instructions (Signed)
Nice to meet you Please try heat  Please try compression   Please send me a message in MyChart with any questions or updates.  We will set up a virtual visit once the MRI is resulted.   --Dr. Raeford Razor

## 2020-11-01 NOTE — Progress Notes (Signed)
  Cheryl Skinner - 75 y.o. female MRN [id number]  Date of birth: 09-Jul-1945  SUBJECTIVE:  Including CC & ROS.  No chief complaint on file.   Cheryl Skinner is a 74 y.o. female that is presenting with left leg pain.  She had a fall at a restaurant a few days ago.  Had significant pain in the posterior aspect of the upper thigh.  Has gotten significant bruising since injury occurred.  Unable to walk or sit very well.  No history of similar symptoms.  Independent review of the AP pelvis and left hip x-ray from 6/2 shows no acute changes.   Review of Systems See HPI   HISTORY: Past Medical, Surgical, Social, and Family History Reviewed & Updated per EMR.   Pertinent Historical Findings include:  Past Medical History:  Diagnosis Date   Hyperlipidemia    Hypertension    Osteopenia    T -2.4 in hip 2021   TMJ (temporomandibular joint syndrome)     Past Surgical History:  Procedure Laterality Date   ABDOMINAL HYSTERECTOMY      History reviewed. No pertinent family history.  Social History   Socioeconomic History   Marital status: Married    Spouse name: Not on file   Number of children: Not on file   Years of education: Not on file   Highest education level: Not on file  Occupational History   Not on file  Tobacco Use   Smoking status: Former    Pack years: 0.00    Types: Cigarettes   Smokeless tobacco: Never  Vaping Use   Vaping Use: Never used  Substance and Sexual Activity   Alcohol use: Yes    Comment: Rare   Drug use: No   Sexual activity: Not on file  Other Topics Concern   Not on file  Social History Narrative   Not on file   Social Determinants of Health   Financial Resource Strain: Not on file  Food Insecurity: Not on file  Transportation Needs: Not on file  Physical Activity: Not on file  Stress: Not on file  Social Connections: Not on file  Intimate Partner Violence: Not on file     PHYSICAL EXAM:  VS: BP 138/60   Ht 5' (1.524 m)   Wt 138 lb  (62.6 kg)   BMI 26.95 kg/m  Physical Exam Gen: NAD, alert, cooperative with exam, well-appearing MSK:  Left thigh: Significant ecchymosis and swelling at the proximal portion. Weakness with resistance to knee flexion. Tenderness to palpation at the origin of the hamstring. Neurovascular intact  Limited ultrasound: Left thigh:  There appears to be complete disruption of the proximal portion of the biceps femoris and semitendinosus.  Hard to determine if this is a complete tendon avulsion versus muscular disruption given the large hematoma on exam.  Summary: Hamstring rupture  Ultrasound and interpretation by Clearance Coots, MD    ASSESSMENT & PLAN:   Rupture of left hamstring tendon Injury occurred on 6/2.  Has clear disruption of the hamstring at the origin.  Hard to discern if this is muscular versus tenderness.  Has large hematoma present. -Counseled on home exercise therapy and supportive care. -Counseled on compression. -MRI to evaluate for tendon rupture of the hamstring origin.

## 2020-11-02 DIAGNOSIS — S76312A Strain of muscle, fascia and tendon of the posterior muscle group at thigh level, left thigh, initial encounter: Secondary | ICD-10-CM | POA: Insufficient documentation

## 2020-11-02 NOTE — Assessment & Plan Note (Signed)
Injury occurred on 6/2.  Has clear disruption of the hamstring at the origin.  Hard to discern if this is muscular versus tenderness.  Has large hematoma present. -Counseled on home exercise therapy and supportive care. -Counseled on compression. -MRI to evaluate for tendon rupture of the hamstring origin.

## 2020-11-06 ENCOUNTER — Ambulatory Visit
Admission: RE | Admit: 2020-11-06 | Discharge: 2020-11-06 | Disposition: A | Discharge status: Home / Self Care | Payer: Medicare Other | Source: Ambulatory Visit | Attending: Family Medicine | Admitting: Family Medicine

## 2020-11-06 ENCOUNTER — Other Ambulatory Visit: Payer: Self-pay

## 2020-11-06 DIAGNOSIS — S76312A Strain of muscle, fascia and tendon of the posterior muscle group at thigh level, left thigh, initial encounter: Secondary | ICD-10-CM

## 2020-11-06 DIAGNOSIS — M7122 Synovial cyst of popliteal space [Baker], left knee: Secondary | ICD-10-CM | POA: Diagnosis not present

## 2020-11-08 ENCOUNTER — Other Ambulatory Visit: Payer: Self-pay

## 2020-11-08 ENCOUNTER — Telehealth (INDEPENDENT_AMBULATORY_CARE_PROVIDER_SITE_OTHER): Payer: Medicare Other | Admitting: Family Medicine

## 2020-11-08 DIAGNOSIS — S76312D Strain of muscle, fascia and tendon of the posterior muscle group at thigh level, left thigh, subsequent encounter: Secondary | ICD-10-CM

## 2020-11-08 NOTE — Progress Notes (Signed)
Virtual Visit via Video Note  I connected with Cheryl Skinner on 11/08/20 at  1:10 PM EDT by a video enabled telemedicine application and verified that I am speaking with the correct person using two identifiers.  Location: Patient: home Provider: office   I discussed the limitations of evaluation and management by telemedicine and the availability of in person appointments. The patient expressed understanding and agreed to proceed.  History of Present Illness:  Cheryl Skinner is a 75 yo F that is following up after the MRI of her left hamstring MRI.  This was demonstrating a complete rupture of the proximal hamstring tendons at the insertion with large hematoma and retracted tendons to 6 and 8 cm.   Observations/Objective:  Gen: NAD, alert, cooperative with exam, well-appearing   Assessment and Plan:  Rupture of the left hamstring tendon: Injury occurred on 6/10 While she was at a restaurant.  MRI was confirming disruption of the origin of the hamstring tendons with retraction and hematoma. -Referral to physical therapy. -Counseled on heat and compression. -Could consider shockwave and nitro patches.   Follow Up Instructions:    I discussed the assessment and treatment plan with the patient. The patient was provided an opportunity to ask questions and all were answered. The patient agreed with the plan and demonstrated an understanding of the instructions.   The patient was advised to call back or seek an in-person evaluation if the symptoms worsen or if the condition fails to improve as anticipated.    Clearance Coots, MD

## 2020-11-08 NOTE — Assessment & Plan Note (Signed)
Injury occurred on 6/10 While she was at a restaurant.  MRI was confirming disruption of the origin of the hamstring tendons with retraction and hematoma. -Referral to physical therapy. -Counseled on heat and compression. -Could consider shockwave and nitro patches.

## 2020-11-09 ENCOUNTER — Other Ambulatory Visit: Payer: Medicare Other

## 2020-11-15 ENCOUNTER — Other Ambulatory Visit: Payer: Self-pay

## 2020-11-15 ENCOUNTER — Ambulatory Visit: Payer: Medicare Other | Attending: Family Medicine

## 2020-11-15 DIAGNOSIS — M79605 Pain in left leg: Secondary | ICD-10-CM | POA: Insufficient documentation

## 2020-11-15 DIAGNOSIS — R2681 Unsteadiness on feet: Secondary | ICD-10-CM | POA: Diagnosis not present

## 2020-11-15 DIAGNOSIS — M6281 Muscle weakness (generalized): Secondary | ICD-10-CM | POA: Insufficient documentation

## 2020-11-15 NOTE — Therapy (Signed)
[facility] [place], [place], [address] Phone: [phone]   Fax:  [phone]  Physical Therapy Evaluation  Patient Details  Name: Cheryl Skinner MRN: [id number] Date of Birth: 1945-08-15 Referring Provider (PT): Rosemarie Ax, MD   Encounter Date: 11/15/2020   PT End of Session - 11/15/20 1720     Visit Number 1    Number of Visits 13    Date for PT Re-Evaluation 12/27/20    Authorization Type [employer] MCR; FOTO on visit 6 and 10    Progress Note Due on Visit 10    PT Start Time 1500    PT Stop Time 1540    PT Time Calculation (min) 40 min    Activity Tolerance Patient tolerated treatment well    Behavior During Therapy [facility] for tasks assessed/performed             Past Medical History:  Diagnosis Date   Hyperlipidemia    Hypertension    Osteopenia    T -2.4 in hip 2021   TMJ (temporomandibular joint syndrome)     Past Surgical History:  Procedure Laterality Date   ABDOMINAL HYSTERECTOMY      There were no vitals filed for this visit.    Subjective Assessment - 11/15/20 1507     Subjective Pt reports falling June 2nd in a restaurant on a slippery floor, resulting in her L knee hyperextending. She reports feeling severe pain on the posterior aspect of her L thigh, followed by substantial bruising. She reports experiencing N/T in the L foot since the fall, although she reports experiencing BIL N/T in her feet prior to her fall. She reports that she will mention this to her PCM at her next visit. She went to the ED, where she received BIL LE X-rays which ruled down fracture. She reports being told by her PCP that her hamstring tendon was "torn from the bone." She presents with an ACE bandage around her L thigh. She reports that sitting is particularly painful. Her daughter is currently helping her to shower due to being unable to wash her legs. The same applies for shaving.    Pertinent History See above  medical history.    Limitations Sitting;House hold activities;Walking    How long can you sit comfortably? 5 minutes    How long can you stand comfortably? Unlimited    How long can you walk comfortably? Unlimited    Diagnostic tests 11/06/2020 MRI of L Femur without contrast: IMPRESSION:  Complete rupture of the proximal hamstring tendons from their  insertion on the ischial tuberosity, with differentially retracted  tendons measuring 6.1 and 8.6 cm. Large surrounding hematoma  measuring 17.7 x 6.7 x 6.7 cm. Reactive intramuscular edema in the  distal hamstring muscles, the adductors and gluteus maximus.     Partially visualized moderate size Baker cyst of the left knee.    Patient Stated Goals Return to doing daily activities like showering, shaving, and doing household chores without pain.    Currently in Pain? Yes    Pain Score 3     Pain Location Leg    Pain Orientation Left;Posterior;Upper    Pain Descriptors / Indicators Aching;Dull    Pain Type Acute pain    Pain Onset 1 to 4 weeks ago    Pain Frequency Intermittent    Aggravating Factors  Sitting, stepping over objects such as when entering the bath tub    Pain Relieving Factors Tylenol, Heat, ACE  bandage    Effect of Pain on Daily Activities See above limitations    Multiple Pain Sites No                [facility] PT Assessment - 11/15/20 0001       Assessment   Medical Diagnosis Rupture of left hamstring tendon, subsequent encounter (G31.517O)    Referring Provider (PT) Rosemarie Ax, MD    Onset Date/Surgical Date 10/26/20    Hand Dominance Right    Next MD Visit Next month    Prior Therapy None      Precautions   Precautions None      Restrictions   Weight Bearing Restrictions No      Balance Screen   Has the patient fallen in the past 6 months Yes    How many times? 1   The slip that resulting in the pt's hamstring injury   Has the patient had a decrease in activity level because of a fear of falling?  No     Is the patient reluctant to leave their home because of a fear of falling?  No      Home Environment   Living Environment Private residence    Living Arrangements Spouse/significant other    Available Help at Discharge Family;Friend(s)    Type of [place] to enter    Entrance Stairs-Number of Steps 3    Entrance Stairs-Rails Right;Left    Home Layout Two level    Alternate Level Stairs-Number of Steps 14    Alternate [place] - single point;Walker - 4 wheels      Prior Function   Level of Independence Independent      Cognition   Overall Cognitive Status Within Functional Limits for tasks assessed      Observation/Other Assessments   Observations Pt demonstrates bruising along the posterior aspect of her L thigh. Pt also demonstrates grossly poor balance with hip stretegy utilized when transferring from chair to table.    Focus on Therapeutic Outcomes (FOTO)  53%      Strength   Right Hip Flexion 5/5    Right Hip ABduction 4/5    Left Hip Flexion 4/5    Left Hip ABduction 4/5    Right Knee Flexion 5/5    Right Knee Extension 5/5    Left Knee Flexion 3+/5   With pain   Left Knee Extension 5/5                        Objective measurements completed on examination: See above findings.       [facility] Adult PT Treatment/Exercise - 11/15/20 0001       Knee/Hip Exercises: Seated   Hamstring Curl --   x10 with RTB     Knee/Hip Exercises: Supine   Bridges 10 reps                    PT Education - 11/15/20 1719     Education Details Pt educated on importance of coming to her appointments on time, along with the mechanism and rehab timeframe for ruptured hamstring musculature. Pt also instructed on proper form when performing newly added HEP. Lastly, pt educated to inform her PCM about the N/T in her feet.    Person(s) Educated Patient    Methods Explanation;Demonstration;Handout     Comprehension Verbalized understanding;Returned demonstration  PT Short Term Goals - 11/15/20 1734       PT SHORT TERM GOAL #1   Title Pt will demonstrate understanding and adherence to HEP in order to achieve independence with management of her primary impairments.    Baseline HEP given at evaluation    Time 3    Period Weeks    Status New    Target Date 12/06/20               PT Long Term Goals - 11/15/20 1740       PT LONG TERM GOAL #1   Title Pt will demonstrate L knee flexion MMT of 4/5 or greater in order to achieve gait efficiency with proper balance.    Baseline 3/5    Time 6    Period Weeks    Status New    Target Date 12/27/20      PT LONG TERM GOAL #2   Title Pt will achieve FOTO score of 72% or higher in order to demonstrate improved functional ability as it relates to her hamstring injury,    Baseline 53%    Time 6    Period Weeks    Status New    Target Date 12/27/20      PT LONG TERM GOAL #3   Title Pt will achieve BIL global hip strength of 5/5 in order to progress independent LE strengthening without limitation.    Baseline BIL hip abduction 4/5, L hip flexion 4/5    Time 6    Period Weeks    Status New    Target Date 12/27/20      PT LONG TERM GOAL #4   Title Pt will report ability to wash her legs and shave in the shower independently with 0/10 pain in order to promote personal hygeine.    Baseline Pt requires daughter's help to wash legs and shave in the shower.    Time 6    Period Weeks    Status New    Target Date 12/27/20                    Plan - 11/15/20 1723     Clinical Impression Statement The pt is a pleasant 75yo F who presents with primary c/o L posterior thigh pain following a slip and fall resulting in L knee hyperextension on 10/26/2020 (20 days s/p). The pt arrived to her appointment 10 minutes late, which resulted in a truncated evaluation. Upon assessment, the pt's primary impairments include  bruising along the L posterior thigh and weak L knee flexion, hip flexion, and hip abduction. The pt also demonstrates decreased walking balance, as evidenced by need to ambulate with rollator. The pt will benefit from skilled PT to address her primary impairments and help her return to her prior level of function without limitation.    Personal Factors and Comorbidities Age;Comorbidity 2    Comorbidities hyperlipodemia, hypertension    Examination-Activity Limitations Bathing;Dressing;Sit;Transfers;Bend;Stairs    Examination-Participation Restrictions Laundry;Cleaning;Driving    Stability/Clinical Decision Making Stable/Uncomplicated    Clinical Decision Making Low    Rehab Potential Good    PT Frequency 2x / week    PT Duration 6 weeks    PT Treatment/Interventions ADLs/Self Care Home Management;Aquatic Therapy;Biofeedback;Moist Heat;Cryotherapy;DME Instruction;Therapeutic activities;Functional mobility training;Stair training;Gait training;Therapeutic exercise;Balance training;Neuromuscular re-education;Compression bandaging;Manual lymph drainage;Manual techniques;Passive range of motion;Patient/family education;Taping;Joint Manipulations    PT Next Visit Plan Assess pt's balance, LE sensation    PT Home Exercise Plan [id number]  Consulted and Agree with Plan of Care Patient             Patient will benefit from skilled therapeutic intervention in order to improve the following deficits and impairments:  Abnormal gait, Decreased activity tolerance, Decreased balance, Decreased endurance, Decreased mobility, Decreased range of motion, Decreased strength, Hypomobility, Increased edema, Impaired flexibility, Pain, Impaired sensation  Visit Diagnosis: Pain in left leg  Unsteadiness on feet  Muscle weakness (generalized)     Problem List Patient Active Problem List   Diagnosis Date Noted   Rupture of left hamstring tendon 11/02/2020   Osteopenia    Hyperlipidemia    Hypertension     TMJ (temporomandibular joint syndrome)     Vanessa Claymont, PT, DPT 11/15/20 5:48 PM   [facility] [address] [place], [place], [address] Phone: [phone]   Fax:  [phone]  Name: ALAYNAH SCHUTTER MRN: [id number] Date of Birth: December 31, 1945

## 2020-11-15 NOTE — Patient Instructions (Signed)
  [id number]

## 2020-11-20 ENCOUNTER — Ambulatory Visit: Payer: Medicare Other | Admitting: Physical Therapy

## 2020-11-22 ENCOUNTER — Ambulatory Visit: Payer: Medicare Other

## 2020-11-22 ENCOUNTER — Other Ambulatory Visit: Payer: Self-pay

## 2020-11-22 ENCOUNTER — Encounter: Payer: Self-pay | Admitting: Nurse Practitioner

## 2020-11-22 ENCOUNTER — Ambulatory Visit (INDEPENDENT_AMBULATORY_CARE_PROVIDER_SITE_OTHER): Payer: Medicare Other | Admitting: Nurse Practitioner

## 2020-11-22 DIAGNOSIS — J069 Acute upper respiratory infection, unspecified: Secondary | ICD-10-CM

## 2020-11-22 MED ORDER — GUAIFENESIN ER 600 MG PO TB12: 600.0000 mg | ORAL_TABLET | Freq: Two times a day (BID) | ORAL | 0 refills | Status: DC | PRN

## 2020-11-22 NOTE — Progress Notes (Signed)
Subjective:    Patient ID: Cheryl Skinner, female    DOB: 1946/03/29, 75 y.o.   MRN: [id number]  HPI: Cheryl Skinner is a 75 y.o. female presenting virtually for body aches.  Chief Complaint  Patient presents with   Illness    Feeling a lot better today, had body aches and nausea with slight fever beginning last Friday. Took day and night quil. Having grooogy, drunk feeling somewhat for the past couple of days   UPPER RESPIRATORY TRACT INFECTION Stayed in bed for 2 days. She is feeling a lot better now.  Onset: Friday  COVID-19 testing history: not tested this occurrence COVID-19 vaccination status: no vaccination documented Fever: no Body aches: yes; better now Cough: yes; productive Shortness of breath: no Wheezing: no Chest pain: no Chest tightness: no Chest congestion: yes; better Nasal congestion: yes Runny nose: no Post nasal drip: no Sneezing: no Sore throat: no Swollen glands: no Sinus pressure: no Headache: no Face pain: no Toothache: no Ear pain: no  Ear pressure: yes  Eyes red/itching:no Eye drainage/crusting: no  Nausea: no  Vomiting: no Diarrhea: no  Change in appetite: yes ; decreased Loss of taste/smell: no  Rash: no Fatigue: yes Sick contacts: no Strep contacts: no  Context: better Recurrent sinusitis: no Treatments attempted: Dayquil, Nyquil Relief with OTC medications: yes   Allergies  Allergen Reactions   Ampicillin     REACTION: makes sick   Codeine     REACTION: loses memory   Hydrocodone     REACTION: makes sick   Minocycline Hcl     REACTION: makes sick   Nitrofurantoin     REACTION: makes sick   Oxycodone Hcl     REACTION: loses memory   Propoxyphene Hcl     REACTION: makes sick   Propoxyphene N-Acetaminophen     REACTION: makes sick    Outpatient Encounter Medications as of 11/22/2020  Medication Sig   albuterol (PROVENTIL HFA;VENTOLIN HFA) 108 (90 Base) MCG/ACT inhaler Inhale 2 puffs into the lungs every 4 (four)  hours as needed for wheezing or shortness of breath.   alendronate (FOSAMAX) 70 MG tablet Take 1 tablet (70 mg total) by mouth every 7 (seven) days. Take with a full glass of water on an empty stomach.   atorvastatin (LIPITOR) 40 MG tablet Take 1 tablet by mouth once daily   cyclobenzaprine (FLEXERIL) 10 MG tablet Take 1 tablet by mouth three times daily as needed   diclofenac (VOLTAREN) 75 MG EC tablet TAKE 1 TABLET BY MOUTH TWICE DAILY AS NEEDED ( NEEDS OFFICE VISIT BEFORE ANYMORE REFILLS)   fluticasone (FLONASE) 50 MCG/ACT nasal spray Place 2 sprays into both nostrils daily.   lisinopril-hydrochlorothiazide (ZESTORETIC) 20-12.5 MG tablet Take 2 tablets by mouth once daily   meclizine (ANTIVERT) 25 MG tablet Take 1 tablet (25 mg total) by mouth 3 (three) times daily as needed for dizziness.   naproxen (NAPROSYN) 375 MG tablet Take 1 tablet (375 mg total) by mouth 2 (two) times daily with a meal. As needed for pain   No facility-administered encounter medications on file as of 11/22/2020.    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 Interim medical history since our last visit reviewed.  Review of Systems Per HPI unless specifically indicated above     Objective:    There were no vitals taken for this visit.  Wt Readings from Last 3 Encounters:  11/08/20 138 lb (62.6 kg)  11/01/20 138 lb (62.6 kg)  10/26/20 138 lb (62.6 kg)    Physical Exam Nursing note reviewed.  Constitutional:      General: She is not in acute distress.    Appearance: Normal appearance. She is not ill-appearing,  toxic-appearing or diaphoretic.  HENT:     Head: Normocephalic and atraumatic.     Right Ear: External ear normal.     Left Ear: External ear normal.     Nose: Nose normal. No congestion or rhinorrhea.     Mouth/Throat:     Mouth: Mucous membranes are moist.     Pharynx: Oropharynx is clear.  Eyes:     General: No scleral icterus.       Right eye: No discharge.        Left eye: No discharge.     Extraocular Movements: Extraocular movements intact.     Conjunctiva/sclera: Conjunctivae normal.  Pulmonary:     Effort: Pulmonary effort is normal. No respiratory distress.  Musculoskeletal:        General: Normal range of motion.  Skin:    Coloration: Skin is not jaundiced or pale.     Findings: No erythema.  Neurological:     General: No focal deficit present.     Mental Status: She is alert and oriented to person, place, and time.     Motor: No weakness.  Psychiatric:        Mood and Affect: Mood normal.        Behavior: Behavior normal.        Thought Content: Thought content normal.        Judgment: Judgment normal.    Results for orders placed or performed in visit on 08/19/19  HM MAMMOGRAPHY  Result Value Ref Range   HM Mammogram 0-4 Bi-Rad 0-4 Bi-Rad, Self Reported Normal      Assessment & Plan:  1. Upper respiratory tract infection, unspecified type Acute, ongoing x 6 days.  Declines respiratory panel testing.  Reassured patient that symptoms and exam findings are most consistent with a viral upper respiratory infection and explained lack of efficacy of antibiotics against viruses.  Discussed expected course and features suggestive of secondary bacterial infection.  Continue supportive care. Increase fluid intake with water or electrolyte solution like pedialyte. Encouraged acetaminophen as needed for fever/pain. Encouraged salt water gargling, chloraseptic spray and throat lozenges. Encouraged OTC guaifenesin. Encouraged saline sinus flushes and/or neti with humidified  air.    - guaiFENesin (MUCINEX) 600 MG 12 hr tablet; Take 1 tablet (600 mg total) by mouth 2 (two) times daily as needed for cough or to loosen phlegm.  Dispense: 30 tablet; Refill: 0    Follow up plan: No follow-ups on file.   Due to the catastrophic nature of the COVID-19 pandemic, this video visit was completed soley via audio and visual contact via [facility] due to the restrictions of the COVID-19 pandemic.  All issues as above were discussed and addressed. Physical exam was done as above through visual confirmation on [employer]. If it was felt that the patient  should be evaluated in the office, they were directed there. The patient verbally consented to this visit. Location of the patient: home Location of the provider: work Those involved with this call:  Provider: Noemi Chapel, DNP, FNP-C CMA: Annabelle Harman, CMA Front Desk/Registration: Santina Evans  Time spent on call:  15 minutes with patient face to face via video conference. More than 50% of this time was spent in counseling and coordination of care. 12 minutes total spent in review of patient's record and preparation of their chart. I verified patient identity using two factors (patient name and date of birth). Patient consents verbally to being seen via telemedicine visit today.

## 2020-11-24 ENCOUNTER — Ambulatory Visit: Payer: Medicare Other | Admitting: Nurse Practitioner

## 2020-11-29 ENCOUNTER — Other Ambulatory Visit: Payer: Self-pay

## 2020-11-29 ENCOUNTER — Ambulatory Visit: Payer: No Typology Code available for payment source | Attending: Family Medicine

## 2020-11-29 DIAGNOSIS — M6281 Muscle weakness (generalized): Secondary | ICD-10-CM | POA: Diagnosis not present

## 2020-11-29 DIAGNOSIS — M79605 Pain in left leg: Secondary | ICD-10-CM | POA: Diagnosis not present

## 2020-11-29 DIAGNOSIS — R2681 Unsteadiness on feet: Secondary | ICD-10-CM | POA: Diagnosis not present

## 2020-11-29 NOTE — Therapy (Signed)
[place], [place], [address] Phone: [phone]   Fax:  [phone]  Physical Therapy Treatment  Patient Details  Name: Cheryl Skinner MRN: [id number] Date of Birth: [birth year] Referring Provider (PT): Rosemarie Ax, MD   Encounter Date: 11/29/2020   PT End of Session - 11/29/20 1628     Visit Number 2    Number of Visits 13    Date for PT Re-Evaluation 12/27/20    Authorization Type UHC MCR; FOTO on visit 6 and 10    Progress Note Due on Visit 10    PT Start Time 1545    PT Stop Time 1630    PT Time Calculation (min) 45 min    Equipment Utilized During Treatment Gait belt    Activity Tolerance Patient tolerated treatment well    Behavior During Therapy WFL for tasks assessed/performed             Past Medical History:  Diagnosis Date   Hyperlipidemia    Hypertension    Osteopenia    T -2.4 in hip 2021   TMJ (temporomandibular joint syndrome)     Past Surgical History:  Procedure Laterality Date   ABDOMINAL HYSTERECTOMY      There were no vitals filed for this visit.   Subjective Assessment - 11/29/20 1542     Subjective Pt reports not being able to do her exercises the past 2 weeks due to being sick. She reports performing her banded exercises, but not the bridges. She reports tenderness when sitting, but otherwise has very minimal pain when standing or walking. She states she has been walking without an AD and has been able to navigate up and down steps when entering and exiting her home with minimal limitation. She adds that she still has numbness in her L toes.    Currently in Pain? Yes    Pain Score 2     Pain Location Leg    Pain Orientation Left;Posterior;Upper    Pain Descriptors / Indicators Aching;Dull    Pain Type --   Subacute   Pain Onset More than a month ago    Pain Frequency Intermittent                [facility] PT Assessment - 11/29/20 0001        Observation/Other Assessments   Observations Pt demonstrates mild-moderate swelling to L posterior thigh      Special Tests    Special Tests Lumbar    Lumbar Tests Slump Test      Slump test   Findings Negative                           [facility] Adult PT Treatment/Exercise - 11/29/20 0001       Knee/Hip Exercises: Standing   Other Standing Knee Exercises SL balance on Airex pad 2x30sec BIL    Other Standing Knee Exercises Step-through on Bosu ball x10 BIL      Knee/Hip Exercises: Supine   Bridges with Ball Squeeze --   2x10 with adductor ball squeeze   Knee Flexion --   Isometric knee flexion 2x10 with 5sec hold     Modalities   Modalities Cryotherapy      Cryotherapy   Cryotherapy Location Other (comment)   L hamstrings   Type of Cryotherapy Ice pack      Manual Therapy   Manual Therapy Neural Stretch;Edema management  Edema Management Edema massage to L posterior thigh    Neural Stretch L sciatic nerve glides 2x20                    PT Education - 11/29/20 1627     Education Details Pt educated on proper form when performing newly added exercises. Also educated on the efficacy of neural glides in addressing her foot N/T.    Person(s) Educated Patient    Methods Explanation;Demonstration;Handout    Comprehension Returned demonstration;Verbalized understanding              PT Skinner Term Goals - 11/15/20 1734       PT Skinner TERM GOAL #1   Title Pt will demonstrate understanding and adherence to HEP in order to achieve independence with management of her primary impairments.    Baseline HEP given at evaluation    Time 3    Period Weeks    Status New    Target Date 12/06/20               PT Long Term Goals - 11/15/20 1740       PT LONG TERM GOAL #1   Title Pt will demonstrate L knee flexion MMT of 4/5 or greater in order to achieve gait efficiency with proper balance.    Baseline 3/5    Time 6    Period Weeks    Status  New    Target Date 12/27/20      PT LONG TERM GOAL #2   Title Pt will achieve FOTO score of 72% or higher in order to demonstrate improved functional ability as it relates to her hamstring injury,    Baseline 53%    Time 6    Period Weeks    Status New    Target Date 12/27/20      PT LONG TERM GOAL #3   Title Pt will achieve BIL global hip strength of 5/5 in order to progress independent LE strengthening without limitation.    Baseline BIL hip abduction 4/5, L hip flexion 4/5    Time 6    Period Weeks    Status New    Target Date 12/27/20      PT LONG TERM GOAL #4   Title Pt will report ability to wash her legs and shave in the shower independently with 0/10 pain in order to promote personal hygeine.    Baseline Pt requires daughter's help to wash legs and shave in the shower.    Time 6    Period Weeks    Status New    Target Date 12/27/20                   Plan - 11/29/20 1629     Clinical Impression Statement Pt responded well to all treatment today, demonstrating proper form and no increase in pain with her new exercises. Additional, she experienced decreased swelling in her L posterior thigh following edema massage and cryotherapy. Rehab protocol from [place] [facility] in being utilized during the pt's POC. She will continue to benefit from skilled PT to address her primary impairments and return to her prior level of function without limitation.    Personal Factors and Comorbidities Age;Comorbidity 2    Comorbidities hyperlipodemia, hypertension    Examination-Activity Limitations Bathing;Dressing;Sit;Transfers;Bend;Stairs    Examination-Participation Restrictions Laundry;Cleaning;Driving    Stability/Clinical Decision Making Stable/Uncomplicated    Clinical Decision Making Low    Rehab Potential Good  PT Frequency 2x / week    PT Duration 6 weeks    PT Treatment/Interventions ADLs/Self Care Home Management;Aquatic Therapy;Biofeedback;Moist  Heat;Cryotherapy;DME Instruction;Therapeutic activities;Functional mobility training;Stair training;Gait training;Therapeutic exercise;Balance training;Neuromuscular re-education;Compression bandaging;Manual lymph drainage;Manual techniques;Passive range of motion;Patient/family education;Taping;Joint Manipulations    PT Next Visit Plan Assess pt's balance, LE sensation, progress hamstring strengthening according to [facility] protocol    PT Home Exercise Plan [id number]    Consulted and Agree with Plan of Care Patient             Patient will benefit from skilled therapeutic intervention in order to improve the following deficits and impairments:  Abnormal gait, Decreased activity tolerance, Decreased balance, Decreased endurance, Decreased mobility, Decreased range of motion, Decreased strength, Hypomobility, Increased edema, Impaired flexibility, Pain, Impaired sensation  Visit Diagnosis: Pain in left leg  Unsteadiness on feet  Muscle weakness (generalized)     Problem List Patient Active Problem List   Diagnosis Date Noted   Rupture of left hamstring tendon 11/02/2020   Osteopenia    Hyperlipidemia    Hypertension    TMJ (temporomandibular joint syndrome)     Vanessa Stamford, PT, DPT 11/29/20 4:40 PM   [facility] [address] [place], [place], [address] Phone: [phone]   Fax:  [phone]  Name: Cheryl Skinner MRN: [id number] Date of Birth: 15-Nov-1945

## 2020-11-29 NOTE — Patient Instructions (Signed)
  [id number]

## 2020-12-04 ENCOUNTER — Other Ambulatory Visit: Payer: Self-pay

## 2020-12-04 ENCOUNTER — Ambulatory Visit: Payer: No Typology Code available for payment source

## 2020-12-04 DIAGNOSIS — R2681 Unsteadiness on feet: Secondary | ICD-10-CM

## 2020-12-04 DIAGNOSIS — M6281 Muscle weakness (generalized): Secondary | ICD-10-CM | POA: Diagnosis not present

## 2020-12-04 DIAGNOSIS — M79605 Pain in left leg: Secondary | ICD-10-CM | POA: Diagnosis not present

## 2020-12-04 NOTE — Therapy (Signed)
[place], [place], [address] Phone: [phone]   Fax:  [phone]  Physical Therapy Treatment  Patient Details  Name: Cheryl Skinner MRN: [id number] Date of Birth: 1946/05/11 Referring Provider (PT): Rosemarie Ax, MD   Encounter Date: 12/04/2020   PT End of Session - 12/04/20 1744     Visit Number 3    Number of Visits 13    Date for PT Re-Evaluation 12/27/20    Authorization Type UHC MCR; FOTO on visit 6 and 10    Progress Note Due on Visit 10    PT Start Time 1700    PT Stop Time 1745    PT Time Calculation (min) 45 min    Equipment Utilized During Treatment Gait belt    Activity Tolerance Patient tolerated treatment well    Behavior During Therapy WFL for tasks assessed/performed             Past Medical History:  Diagnosis Date   Hyperlipidemia    Hypertension    Osteopenia    T -2.4 in hip 2021   TMJ (temporomandibular joint syndrome)     Past Surgical History:  Procedure Laterality Date   ABDOMINAL HYSTERECTOMY      There were no vitals filed for this visit.   Subjective Assessment - 12/04/20 1704     Subjective Pt reports a new onset of R lower rib pain that hurts when "moving around." She reports that her L posterior thigh is doing better, adding that she was able to walk around [place] yesterday without limitation. Pt reports being able to do "some, not all" of her exercises.    Currently in Pain? Yes    Pain Score 1     Pain Location Leg    Pain Orientation Left;Posterior;Upper    Pain Descriptors / Indicators Aching;Dull                               [facility] Adult PT Treatment/Exercise - 12/04/20 0001       Knee/Hip Exercises: Standing   Other Standing Knee Exercises Mini-squat side steps 3x54ft      Knee/Hip Exercises: Supine   Bridges --   2x10 marching bridges BIL     Knee/Hip Exercises: Prone   Other Prone Exercises Eccentric  hamstring lowering off edge of table with 2# ankle weights 2x10      Cryotherapy   Number Minutes Cryotherapy 10 Minutes    Cryotherapy Location --   L hamstrings   Type of Cryotherapy Ice pack                    PT Education - 12/04/20 1744     Education Details Pt educated on proper form when performing newly added exercises. Also educated on importance of gentle hamstring stretching prior to more rigorous strengthening exercises.    Person(s) Educated Patient    Methods Explanation;Demonstration;Handout    Comprehension Returned demonstration;Verbal cues required              PT Short Term Goals - 11/15/20 1734       PT SHORT TERM GOAL #1   Title Pt will demonstrate understanding and adherence to HEP in order to achieve independence with management of her primary impairments.    Baseline HEP given at evaluation    Time 3    Period Weeks    Status New  Target Date 12/06/20               PT Long Term Goals - 11/15/20 1740       PT LONG TERM GOAL #1   Title Pt will demonstrate L knee flexion MMT of 4/5 or greater in order to achieve gait efficiency with proper balance.    Baseline 3/5    Time 6    Period Weeks    Status New    Target Date 12/27/20      PT LONG TERM GOAL #2   Title Pt will achieve FOTO score of 72% or higher in order to demonstrate improved functional ability as it relates to her hamstring injury,    Baseline 53%    Time 6    Period Weeks    Status New    Target Date 12/27/20      PT LONG TERM GOAL #3   Title Pt will achieve BIL global hip strength of 5/5 in order to progress independent LE strengthening without limitation.    Baseline BIL hip abduction 4/5, L hip flexion 4/5    Time 6    Period Weeks    Status New    Target Date 12/27/20      PT LONG TERM GOAL #4   Title Pt will report ability to wash her legs and shave in the shower independently with 0/10 pain in order to promote personal hygeine.    Baseline Pt  requires daughter's help to wash legs and shave in the shower.    Time 6    Period Weeks    Status New    Target Date 12/27/20                   Plan - 12/04/20 1745     Clinical Impression Statement Pt responded well to all treatment today, demonstrating proper form and no increase in pain with her new exercises. In particular, she responded well to light eccentric hamstring strengthening and gentle stretching. Additional, she experienced decreased swelling in her L posterior thigh following cryotherapy. She reports mildly increased pain at the end of the session with 3/10 pain. Rehab protocol from [place] [facility] in being utilized during the pt's POC. She will continue to benefit from skilled PT to address her primary impairments and return to her prior level of function without limitation.    Personal Factors and Comorbidities Age;Comorbidity 2    Comorbidities hyperlipodemia, hypertension    Examination-Activity Limitations Bathing;Dressing;Sit;Transfers;Bend;Stairs    Examination-Participation Restrictions Laundry;Cleaning;Driving    Stability/Clinical Decision Making Stable/Uncomplicated    Clinical Decision Making Low    Rehab Potential Good    PT Frequency 2x / week    PT Duration 6 weeks    PT Treatment/Interventions ADLs/Self Care Home Management;Aquatic Therapy;Biofeedback;Moist Heat;Cryotherapy;DME Instruction;Therapeutic activities;Functional mobility training;Stair training;Gait training;Therapeutic exercise;Balance training;Neuromuscular re-education;Compression bandaging;Manual lymph drainage;Manual techniques;Passive range of motion;Patient/family education;Taping;Joint Manipulations    PT Next Visit Plan Assess pt's balance, LE sensation, progress hamstring strengthening according to [facility] protocol    PT Home Exercise Plan [id number]    Consulted and Agree with Plan of Care Patient             Patient will benefit from skilled therapeutic  intervention in order to improve the following deficits and impairments:  Abnormal gait, Decreased activity tolerance, Decreased balance, Decreased endurance, Decreased mobility, Decreased range of motion, Decreased strength, Hypomobility, Increased edema, Impaired flexibility, Pain, Impaired sensation  Visit Diagnosis: Pain in left leg  Unsteadiness on feet  Muscle weakness (generalized)     Problem List Patient Active Problem List   Diagnosis Date Noted   Rupture of left hamstring tendon 11/02/2020   Osteopenia    Hyperlipidemia    Hypertension    TMJ (temporomandibular joint syndrome)     Vanessa Coleman, PT, DPT 12/04/20 5:51 PM   [place] [facility] [address] [place], [place], [address] Phone: [phone]   Fax:  [phone]  Name: Cheryl Skinner MRN: [id number] Date of Birth: 10-21-1945

## 2020-12-04 NOTE — Patient Instructions (Signed)
  [id number]

## 2020-12-06 ENCOUNTER — Other Ambulatory Visit: Payer: Self-pay

## 2020-12-06 ENCOUNTER — Ambulatory Visit: Payer: No Typology Code available for payment source

## 2020-12-06 DIAGNOSIS — R2681 Unsteadiness on feet: Secondary | ICD-10-CM

## 2020-12-06 DIAGNOSIS — M6281 Muscle weakness (generalized): Secondary | ICD-10-CM | POA: Diagnosis not present

## 2020-12-06 DIAGNOSIS — M79605 Pain in left leg: Secondary | ICD-10-CM

## 2020-12-06 NOTE — Therapy (Signed)
[facility] [place], [place], [address] Phone: [phone]   Fax:  [phone]  Physical Therapy Treatment  Patient Details  Name: Cheryl Skinner MRN: [id number] Date of Birth: 1946/02/18 Referring Provider (PT): Rosemarie Ax, MD   Encounter Date: 12/06/2020   PT End of Session - 12/06/20 1736     Visit Number 4    Number of Visits 13    Date for PT Re-Evaluation 12/27/20    Authorization Type U[employer] MCR; FOTO on visit 6 and 10    Progress Note Due on Visit 10    PT Start Time 1655    PT Stop Time 1740    PT Time Calculation (min) 45 min    Equipment Utilized During Treatment Gait belt    Activity Tolerance Patient tolerated treatment well    Behavior During Therapy WFL for tasks assessed/performed             Past Medical History:  Diagnosis Date   Hyperlipidemia    Hypertension    Osteopenia    T -2.4 in hip 2021   TMJ (temporomandibular joint syndrome)     Past Surgical History:  Procedure Laterality Date   ABDOMINAL HYSTERECTOMY      There were no vitals filed for this visit.   Subjective Assessment - 12/06/20 1647     Subjective Pt reports feeling fine today, although she reports having L hip and R rib pain. She reports being adherent with her HEP, although she reports shoulder pain when performing bridges.    Pertinent History See above medical history.    Limitations Sitting;House hold activities;Walking    Currently in Pain? Yes    Pain Score 1     Pain Location Leg    Pain Orientation Left;Posterior;Upper    Pain Descriptors / Indicators Aching;Dull                               [facility] Adult PT Treatment/Exercise - 12/06/20 0001       Knee/Hip Exercises: Stretches   Other Knee/Hip Stretches Standing windmill stretches 2x20      Knee/Hip Exercises: Aerobic   Other Aerobic Boxer shuffle 2x30sec      Knee/Hip Exercises: Standing   Other Standing Knee  Exercises RDL with 5# DB 2x8 BIL in // bars      Knee/Hip Exercises: Prone   Other Prone Exercises Eccentric hamstring lowering off edge of table with 3# ankle weights 2x8      Cryotherapy   Number Minutes Cryotherapy 10 Minutes    Cryotherapy Location Other (comment)   L hamstrings   Type of Cryotherapy Ice pack      Manual Therapy   Manual Therapy Edema management    Edema Management Edema massage to L posterior thigh                    PT Education - 12/06/20 1735     Education Details Pt educated on proper form when performing newly added exercises. Educated on hamstring anatomy and physiology.    Person(s) Educated Patient    Methods Explanation;Demonstration;Handout    Comprehension Returned demonstration;Verbalized understanding              PT Short Term Goals - 11/15/20 1734       PT SHORT TERM GOAL #1   Title Pt will demonstrate understanding and adherence to HEP in order to  achieve independence with management of her primary impairments.    Baseline HEP given at evaluation    Time 3    Period Weeks    Status New    Target Date 12/06/20               PT Long Term Goals - 11/15/20 1740       PT LONG TERM GOAL #1   Title Pt will demonstrate L knee flexion MMT of 4/5 or greater in order to achieve gait efficiency with proper balance.    Baseline 3/5    Time 6    Period Weeks    Status New    Target Date 12/27/20      PT LONG TERM GOAL #2   Title Pt will achieve FOTO score of 72% or higher in order to demonstrate improved functional ability as it relates to her hamstring injury,    Baseline 53%    Time 6    Period Weeks    Status New    Target Date 12/27/20      PT LONG TERM GOAL #3   Title Pt will achieve BIL global hip strength of 5/5 in order to progress independent LE strengthening without limitation.    Baseline BIL hip abduction 4/5, L hip flexion 4/5    Time 6    Period Weeks    Status New    Target Date 12/27/20      PT  LONG TERM GOAL #4   Title Pt will report ability to wash her legs and shave in the shower independently with 0/10 pain in order to promote personal hygeine.    Baseline Pt requires daughter's help to wash legs and shave in the shower.    Time 6    Period Weeks    Status New    Target Date 12/27/20                   Plan - 12/06/20 1736     Clinical Impression Statement Pt responded well to all treatment today, demonstrating proper form and no increase in pain with her new exercises. She required UE support when performing single leg RDL, but otherwise, performed the exercise with good form. Additionally, she experienced decreased swelling in her L posterior thigh following cryotherapy. She reports no change in pain at the end of the session. Rehab protocol from [place] in being utilized during the pt's POC. She will continue to benefit from skilled PT to address her primary impairments and return to her prior level of function without limitation.    Personal Factors and Comorbidities Age;Comorbidity 2    Comorbidities hyperlipodemia, hypertension    Examination-Activity Limitations Bathing;Dressing;Sit;Transfers;Bend;Stairs    Examination-Participation Restrictions Laundry;Cleaning;Driving    Stability/Clinical Decision Making Stable/Uncomplicated    Clinical Decision Making Low    Rehab Potential Good    PT Frequency 2x / week    PT Duration 6 weeks    PT Treatment/Interventions ADLs/Self Care Home Management;Aquatic Therapy;Biofeedback;Moist Heat;Cryotherapy;DME Instruction;Therapeutic activities;Functional mobility training;Stair training;Gait training;Therapeutic exercise;Balance training;Neuromuscular re-education;Compression bandaging;Manual lymph drainage;Manual techniques;Passive range of motion;Patient/family education;Taping;Joint Manipulations    PT Next Visit Plan Assess pt's balance, LE sensation, progress hamstring strengthening according to  [facility] protocol    PT Home Exercise Plan [id number]    Consulted and Agree with Plan of Care Patient             Patient will benefit from skilled therapeutic intervention in order to improve the following deficits  and impairments:  Abnormal gait, Decreased activity tolerance, Decreased balance, Decreased endurance, Decreased mobility, Decreased range of motion, Decreased strength, Hypomobility, Increased edema, Impaired flexibility, Pain, Impaired sensation  Visit Diagnosis: Pain in left leg  Unsteadiness on feet  Muscle weakness (generalized)     Problem List Patient Active Problem List   Diagnosis Date Noted   Rupture of left hamstring tendon 11/02/2020   Osteopenia    Hyperlipidemia    Hypertension    TMJ (temporomandibular joint syndrome)     Vanessa , PT, DPT 12/06/20 5:40 PM   [facility] [address] [place], [place], [address] Phone: [phone]   Fax:  [phone]  Name: Cheryl Skinner MRN: [id number] Date of Birth: 1945/12/17

## 2020-12-06 NOTE — Patient Instructions (Signed)
  [id number]

## 2020-12-11 ENCOUNTER — Encounter: Payer: Self-pay | Admitting: Family Medicine

## 2020-12-11 ENCOUNTER — Ambulatory Visit: Payer: No Typology Code available for payment source

## 2020-12-11 ENCOUNTER — Ambulatory Visit (INDEPENDENT_AMBULATORY_CARE_PROVIDER_SITE_OTHER): Payer: Medicare Other | Admitting: Family Medicine

## 2020-12-11 ENCOUNTER — Other Ambulatory Visit: Payer: Self-pay

## 2020-12-11 VITALS — BP 132/80 | HR 94 | Temp 98.7°F | Resp 16 | Ht 60.0 in | Wt 139.0 lb

## 2020-12-11 DIAGNOSIS — I10 Essential (primary) hypertension: Secondary | ICD-10-CM | POA: Diagnosis not present

## 2020-12-11 DIAGNOSIS — M6281 Muscle weakness (generalized): Secondary | ICD-10-CM

## 2020-12-11 DIAGNOSIS — R2681 Unsteadiness on feet: Secondary | ICD-10-CM | POA: Diagnosis not present

## 2020-12-11 DIAGNOSIS — M79605 Pain in left leg: Secondary | ICD-10-CM

## 2020-12-11 DIAGNOSIS — E7800 Pure hypercholesterolemia, unspecified: Secondary | ICD-10-CM

## 2020-12-11 DIAGNOSIS — R0989 Other specified symptoms and signs involving the circulatory and respiratory systems: Secondary | ICD-10-CM | POA: Diagnosis not present

## 2020-12-11 MED ORDER — DICLOFENAC SODIUM 75 MG PO TBEC: DELAYED_RELEASE_TABLET | ORAL | 2 refills | Status: DC

## 2020-12-11 MED ORDER — ATORVASTATIN CALCIUM 40 MG PO TABS: 40.0000 mg | ORAL_TABLET | Freq: Every day | ORAL | 2 refills | Status: DC

## 2020-12-11 MED ORDER — LISINOPRIL-HYDROCHLOROTHIAZIDE 20-12.5 MG PO TABS: 2.0000 | ORAL_TABLET | Freq: Every day | ORAL | 1 refills | Status: DC

## 2020-12-11 NOTE — Patient Instructions (Signed)
HEP reviewed with pt. Stressed importance of regular hamstring stretching/ strengthening.

## 2020-12-11 NOTE — Progress Notes (Signed)
Subjective:    Patient ID: Cheryl Skinner, female    DOB: 1945-09-14, 75 y.o.   MRN: [id number]  HPI Patient is here today to recheck her blood pressure.  Her blood pressure is well controlled at 132/80.  She denies any chest pain shortness of breath or dyspnea on exertion.  She is overdue for fasting lab work.  She states that she is run out of her cholesterol medication.  Today on examination she does have a right-sided carotid bruit.  She denies any TIA or strokelike symptoms.  She recently tore her hamstring in an injury at a restaurant.  She has been receiving physical therapy for this. Past Medical History:  Diagnosis Date   Hyperlipidemia    Hypertension    Osteopenia    T -2.4 in hip 2021   TMJ (temporomandibular joint syndrome)    Past Surgical History:  Procedure Laterality Date   ABDOMINAL HYSTERECTOMY     Current Outpatient Medications on File Prior to Visit  Medication Sig Dispense Refill   atorvastatin (LIPITOR) 40 MG tablet Take 1 tablet by mouth once daily 90 tablet 0   cyclobenzaprine (FLEXERIL) 10 MG tablet Take 1 tablet by mouth three times daily as needed 30 tablet 0   diclofenac (VOLTAREN) 75 MG EC tablet TAKE 1 TABLET BY MOUTH TWICE DAILY AS NEEDED ( NEEDS OFFICE VISIT BEFORE ANYMORE REFILLS) 60 tablet 2   fluticasone (FLONASE) 50 MCG/ACT nasal spray Place 2 sprays into both nostrils daily. 16 g 0   lisinopril-hydrochlorothiazide (ZESTORETIC) 20-12.5 MG tablet Take 2 tablets by mouth once daily 180 tablet 0   meclizine (ANTIVERT) 25 MG tablet Take 1 tablet (25 mg total) by mouth 3 (three) times daily as needed for dizziness. 30 tablet 1   albuterol (PROVENTIL HFA;VENTOLIN HFA) 108 (90 Base) MCG/ACT inhaler Inhale 2 puffs into the lungs every 4 (four) hours as needed for wheezing or shortness of breath. (Patient not taking: Reported on 12/11/2020) 1 Inhaler 0   No current facility-administered medications on file prior to visit.   Allergies  Allergen Reactions    Ampicillin     REACTION: makes sick   Codeine     REACTION: loses memory   Hydrocodone     REACTION: makes sick   Minocycline Hcl     REACTION: makes sick   Nitrofurantoin     REACTION: makes sick   Oxycodone Hcl     REACTION: loses memory   Propoxyphene Hcl     REACTION: makes sick   Propoxyphene N-Acetaminophen     REACTION: makes sick   Social History   Socioeconomic History   Marital status: Married    Spouse name: Not on file   Number of children: Not on file   Years of education: Not on file   Highest education level: Not on file  Occupational History   Not on file  Tobacco Use   Smoking status: Former    Types: Cigarettes   Smokeless tobacco: Never  Vaping Use   Vaping Use: Never used  Substance and Sexual Activity   Alcohol use: Yes    Comment: Rare   Drug use: No   Sexual activity: Not on file  Other Topics Concern   Not on file  Social History Narrative   Not on file   Social Determinants of Health   Financial Resource Strain: Not on file  Food Insecurity: Not on file  Transportation Needs: Not on file  Physical Activity: Not on file  Stress: Not on file  Social Connections: Not on file  Intimate Partner Violence: Not on file     Review of Systems  All other systems reviewed and are negative.     Objective:   Physical Exam Constitutional:      General: She is not in acute distress.    Appearance: Normal appearance. She is normal weight. She is not ill-appearing or toxic-appearing.  Neck:     Vascular: Carotid bruit present.  Cardiovascular:     Rate and Rhythm: Normal rate. Rhythm irregular.     Pulses: Normal pulses.     Heart sounds: Normal heart sounds. No murmur heard.   No gallop.  Pulmonary:     Effort: Pulmonary effort is normal. No respiratory distress.     Breath sounds: Normal breath sounds. No stridor. No wheezing, rhonchi or rales.  Abdominal:     General: Abdomen is flat. Bowel sounds are normal. There is no distension.      Palpations: Abdomen is soft.     Tenderness: There is no abdominal tenderness. There is no guarding.  Musculoskeletal:     Right lower leg: No edema.     Left lower leg: No edema.  Neurological:     Mental Status: She is alert.          Assessment & Plan:  Pure hypercholesterolemia  Essential hypertension  I will schedule the patient for right-sided carotid Doppler to evaluate the bruit.  Blood pressure today is well controlled.  Return fasting for a CBC CMP and fasting lipid panel.

## 2020-12-11 NOTE — Therapy (Signed)
[place], [place], [address] Phone: [phone]   Fax:  [phone]  Physical Therapy Treatment  Patient Details  Name: Cheryl Skinner MRN: [id number] Date of Birth: [birth year] Referring Provider (PT): Rosemarie Ax, MD   Encounter Date: 12/11/2020   PT End of Session - 12/11/20 1742     Visit Number 5    Number of Visits 13    Date for PT Re-Evaluation 12/27/20    Authorization Type UHC MCR; FOTO on visit 6 and 10    Progress Note Due on Visit 10    PT Start Time 1700    PT Stop Time 1745    PT Time Calculation (min) 45 min    Activity Tolerance Patient tolerated treatment well    Behavior During Therapy [facility] for tasks assessed/performed             Past Medical History:  Diagnosis Date   Hyperlipidemia    Hypertension    Osteopenia    T -2.4 in hip 2021   TMJ (temporomandibular joint syndrome)     Past Surgical History:  Procedure Laterality Date   ABDOMINAL HYSTERECTOMY      There were no vitals filed for this visit.   Subjective Assessment - 12/11/20 1706     Subjective Pt reports feeling well today, adding that she only has pain when sitting on her hamstring. She saw her PCP today to review her medication and present condition. She reports she got a good report today. She reports being adherent to her HEP, particularly with her stretches.    Currently in Pain? Yes    Pain Score 1     Pain Location Leg    Pain Orientation Left;Posterior;Upper    Pain Descriptors / Indicators Aching;Dull    Pain Type Chronic pain    Pain Onset More than a month ago    Pain Frequency Intermittent                               [facility] Adult PT Treatment/Exercise - 12/11/20 0001       Knee/Hip Exercises: Aerobic   Nustep 5 min on lvl 2 40-50 steps/min      Knee/Hip Exercises: Supine   Bridges --   hamstring curl 2x10 on red physioball     Knee/Hip Exercises: Prone    Other Prone Exercises Nordic hamstring curl with Green pull-up band assistance 2x10      Cryotherapy   Number Minutes Cryotherapy 10 Minutes    Cryotherapy Location Other (comment)   L hamstring   Type of Cryotherapy Ice pack      Manual Therapy   Manual Therapy Edema management    Edema Management Edema massage to L posterior thigh                    PT Education - 12/11/20 1742     Education Details Pt educated on proper form when performing newly added exercises, namely Nordic hamstring curls.    Person(s) Educated Patient    Methods Explanation;Demonstration    Comprehension Verbalized understanding;Returned demonstration              PT Short Term Goals - 11/15/20 1734       PT SHORT TERM GOAL #1   Title Pt will demonstrate understanding and adherence to HEP in order to achieve independence with management of her primary impairments.  Baseline HEP given at evaluation    Time 3    Period Weeks    Status New    Target Date 12/06/20               PT Long Term Goals - 11/15/20 1740       PT LONG TERM GOAL #1   Title Pt will demonstrate L knee flexion MMT of 4/5 or greater in order to achieve gait efficiency with proper balance.    Baseline 3/5    Time 6    Period Weeks    Status New    Target Date 12/27/20      PT LONG TERM GOAL #2   Title Pt will achieve FOTO score of 72% or higher in order to demonstrate improved functional ability as it relates to her hamstring injury,    Baseline 53%    Time 6    Period Weeks    Status New    Target Date 12/27/20      PT LONG TERM GOAL #3   Title Pt will achieve BIL global hip strength of 5/5 in order to progress independent LE strengthening without limitation.    Baseline BIL hip abduction 4/5, L hip flexion 4/5    Time 6    Period Weeks    Status New    Target Date 12/27/20      PT LONG TERM GOAL #4   Title Pt will report ability to wash her legs and shave in the shower independently with 0/10  pain in order to promote personal hygeine.    Baseline Pt requires daughter's help to wash legs and shave in the shower.    Time 6    Period Weeks    Status New    Target Date 12/27/20                   Plan - 12/11/20 1743     Clinical Impression Statement Pt responded well to all treatment today, demonstrating proper form and no increase in pain with her new exercises. Additionally, she experienced decreased swelling in her L posterior thigh following cryotherapy. She reports no change in pain at the end of the session. Rehab protocol from [facility] in being utilized during the pt's POC. She will continue to benefit from skilled PT to address her primary impairments and return to her prior level of function without limitation.    Personal Factors and Comorbidities Age;Comorbidity 2    Comorbidities hyperlipodemia, hypertension    Examination-Activity Limitations Bathing;Dressing;Sit;Transfers;Bend;Stairs    Examination-Participation Restrictions Laundry;Cleaning;Driving    Stability/Clinical Decision Making Stable/Uncomplicated    Clinical Decision Making Low    Rehab Potential Good    PT Frequency 2x / week    PT Duration 6 weeks    PT Treatment/Interventions ADLs/Self Care Home Management;Aquatic Therapy;Biofeedback;Moist Heat;Cryotherapy;DME Instruction;Therapeutic activities;Functional mobility training;Stair training;Gait training;Therapeutic exercise;Balance training;Neuromuscular re-education;Compression bandaging;Manual lymph drainage;Manual techniques;Passive range of motion;Patient/family education;Taping;Joint Manipulations    PT Next Visit Plan Assess pt's balance, LE sensation, progress hamstring strengthening according to [facility] protocol    PT Home Exercise Plan [id number]    Consulted and Agree with Plan of Care Patient             Patient will benefit from skilled therapeutic intervention in order to improve the following deficits and  impairments:  Abnormal gait, Decreased activity tolerance, Decreased balance, Decreased endurance, Decreased mobility, Decreased range of motion, Decreased strength, Hypomobility, Increased edema, Impaired flexibility, Pain, Impaired sensation  Visit Diagnosis: Pain in left leg  Unsteadiness on feet  Muscle weakness (generalized)     Problem List Patient Active Problem List   Diagnosis Date Noted   Rupture of left hamstring tendon 11/02/2020   Osteopenia    Hyperlipidemia    Hypertension    TMJ (temporomandibular joint syndrome)     Vanessa [place], PT, DPT 12/11/20 5:47 PM   [facility] [address] [place], [place], [address] Phone: [phone]   Fax:  [phone]  Name: BEXLEE BERGDOLL MRN: [id number] Date of Birth: 03/24/1946

## 2020-12-13 ENCOUNTER — Ambulatory Visit: Payer: No Typology Code available for payment source

## 2020-12-13 ENCOUNTER — Other Ambulatory Visit: Payer: Self-pay

## 2020-12-13 DIAGNOSIS — M6281 Muscle weakness (generalized): Secondary | ICD-10-CM

## 2020-12-13 DIAGNOSIS — M79605 Pain in left leg: Secondary | ICD-10-CM | POA: Diagnosis not present

## 2020-12-13 DIAGNOSIS — R2681 Unsteadiness on feet: Secondary | ICD-10-CM | POA: Diagnosis not present

## 2020-12-13 NOTE — Therapy (Signed)
[place], [place], [address] Phone: [phone]   Fax:  [phone]  Physical Therapy Treatment  Patient Details  Name: Cheryl Skinner MRN: [id number] Date of Birth: 04-17-1946 Referring Provider (PT): Rosemarie Ax, MD   Encounter Date: 12/13/2020   PT End of Session - 12/13/20 1738     Visit Number 6    Number of Visits 13    Date for PT Re-Evaluation 12/27/20    Authorization Type UHC MCR; FOTO on visit 6 and 10    Progress Note Due on Visit 10    PT Start Time 6226    PT Stop Time 1753    PT Time Calculation (min) 38 min    Activity Tolerance Patient tolerated treatment well    Behavior During Therapy [facility] for tasks assessed/performed             Past Medical History:  Diagnosis Date   Hyperlipidemia    Hypertension    Osteopenia    T -2.4 in hip 2021   TMJ (temporomandibular joint syndrome)     Past Surgical History:  Procedure Laterality Date   ABDOMINAL HYSTERECTOMY      There were no vitals filed for this visit.   Subjective Assessment - 12/13/20 1713     Subjective Pt reports feeling well today, although she is tired due to going to [place] every night this week. She reports walking up and down stairs several times today, as well as walking through [place] without any difficulty. She has not been as adherent with her HEP due to the increased activity this week.    Currently in Pain? Yes    Pain Score 1     Pain Location Leg    Pain Orientation Left;Posterior;Upper    Pain Descriptors / Indicators Aching;Dull                [facility] PT Assessment - 12/13/20 0001       Observation/Other Assessments   Focus on Therapeutic Outcomes (FOTO)  60%                           [facility] Adult PT Treatment/Exercise - 12/13/20 0001       Knee/Hip Exercises: Stretches   Other Knee/Hip Stretches Standing windmill stretches 2x20      Knee/Hip Exercises: Aerobic    Other Aerobic Boxer shuffle 2x30sec      Knee/Hip Exercises: Standing   Other Standing Knee Exercises Mini squat hop 2x10      Knee/Hip Exercises: Prone   Other Prone Exercises Nordic hamstring curl with Green pull-up band assistance 2x10      Cryotherapy   Number Minutes Cryotherapy 10 Minutes    Cryotherapy Location Other (comment)    Type of Cryotherapy Ice pack                    PT Education - 12/13/20 1738     Education Details Reviewed HEP with pt, emphasizing proper form.    Person(s) Educated Patient    Methods Explanation;Demonstration    Comprehension Verbalized understanding;Returned demonstration              PT Short Term Goals - 11/15/20 1734       PT SHORT TERM GOAL #1   Title Pt will demonstrate understanding and adherence to HEP in order to achieve independence with management of her primary impairments.    Baseline  HEP given at evaluation    Time 3    Period Weeks    Status New    Target Date 12/06/20               PT Long Term Goals - 11/15/20 1740       PT LONG TERM GOAL #1   Title Pt will demonstrate L knee flexion MMT of 4/5 or greater in order to achieve gait efficiency with proper balance.    Baseline 3/5    Time 6    Period Weeks    Status New    Target Date 12/27/20      PT LONG TERM GOAL #2   Title Pt will achieve FOTO score of 72% or higher in order to demonstrate improved functional ability as it relates to her hamstring injury,    Baseline 53%    Time 6    Period Weeks    Status New    Target Date 12/27/20      PT LONG TERM GOAL #3   Title Pt will achieve BIL global hip strength of 5/5 in order to progress independent LE strengthening without limitation.    Baseline BIL hip abduction 4/5, L hip flexion 4/5    Time 6    Period Weeks    Status New    Target Date 12/27/20      PT LONG TERM GOAL #4   Title Pt will report ability to wash her legs and shave in the shower independently with 0/10 pain in order  to promote personal hygeine.    Baseline Pt requires daughter's help to wash legs and shave in the shower.    Time 6    Period Weeks    Status New    Target Date 12/27/20                   Plan - 12/13/20 1738     Clinical Impression Statement Pt arrived to her treatment today 12 minutes late, which led to a truncated session today. She responded well to all treatment today, demonstrating proper form and no increase in pain with her new exercises. Additionally, she experienced decreased swelling in her L posterior thigh following cryotherapy. She reports no change in pain at the end of the session. Rehab protocol from [facility] in being utilized during the pt's POC. She will continue to benefit from skilled PT to address her primary impairments and return to her prior level of function without limitation.    Personal Factors and Comorbidities Age;Comorbidity 2    Comorbidities hyperlipodemia, hypertension    Examination-Activity Limitations Bathing;Dressing;Sit;Transfers;Bend;Stairs    Examination-Participation Restrictions Laundry;Cleaning;Driving    Stability/Clinical Decision Making Stable/Uncomplicated    Clinical Decision Making Low    Rehab Potential Good    PT Frequency 2x / week    PT Duration 6 weeks    PT Treatment/Interventions ADLs/Self Care Home Management;Aquatic Therapy;Biofeedback;Moist Heat;Cryotherapy;DME Instruction;Therapeutic activities;Functional mobility training;Stair training;Gait training;Therapeutic exercise;Balance training;Neuromuscular re-education;Compression bandaging;Manual lymph drainage;Manual techniques;Passive range of motion;Patient/family education;Taping;Joint Manipulations    PT Next Visit Plan Assess pt's balance, LE sensation, progress hamstring strengthening according to [facility] protocol    PT Home Exercise Plan [id number]    Consulted and Agree with Plan of Care Patient             Patient will benefit from  skilled therapeutic intervention in order to improve the following deficits and impairments:  Abnormal gait, Decreased activity tolerance, Decreased balance, Decreased endurance, Decreased  mobility, Decreased range of motion, Decreased strength, Hypomobility, Increased edema, Impaired flexibility, Pain, Impaired sensation  Visit Diagnosis: Pain in left leg  Unsteadiness on feet  Muscle weakness (generalized)     Problem List Patient Active Problem List   Diagnosis Date Noted   Rupture of left hamstring tendon 11/02/2020   Osteopenia    Hyperlipidemia    Hypertension    TMJ (temporomandibular joint syndrome)     Vanessa [place], PT, DPT 12/13/20 5:46 PM   [facility] [address] [place], [place], [address] Phone: [phone]   Fax:  [phone]  Name: Cheryl Skinner MRN: [id number] Date of Birth: Mar 05, 1946

## 2020-12-13 NOTE — Patient Instructions (Signed)
HEP reviewed with pt. No new exercises added due to time constraint and not being adherent to exercises over past week.

## 2020-12-14 ENCOUNTER — Other Ambulatory Visit: Payer: Medicare Other

## 2020-12-14 DIAGNOSIS — I10 Essential (primary) hypertension: Secondary | ICD-10-CM

## 2020-12-14 DIAGNOSIS — E7800 Pure hypercholesterolemia, unspecified: Secondary | ICD-10-CM

## 2020-12-14 LAB — LIPID PANEL
Cholesterol: 208 mg/dL — ABNORMAL HIGH (ref <200)
HDL: 49 mg/dL — ABNORMAL LOW (ref >=50)
LDL Cholesterol (Calc): 133 mg/dL (calc) — ABNORMAL HIGH
Non-HDL Cholesterol (Calc): 159 mg/dL (calc) — ABNORMAL HIGH (ref <130)
Total CHOL/HDL Ratio: 4.2 (calc) (ref <5.0)
Triglycerides: 138 mg/dL (ref <150)

## 2020-12-14 LAB — COMPLETE METABOLIC PANEL WITH GFR
AG Ratio: 1.7 (calc) (ref 1.0–2.5)
ALT: 17 U/L (ref 6–29)
AST: 19 U/L (ref 10–35)
Albumin: 4 g/dL (ref 3.6–5.1)
Alkaline phosphatase (APISO): 49 U/L (ref 37–153)
BUN: 17 mg/dL (ref 7–25)
CO2: 29 mmol/L (ref 20–32)
Calcium: 10.1 mg/dL (ref 8.6–10.4)
Chloride: 106 mmol/L (ref 98–110)
Creat: 0.82 mg/dL (ref 0.60–1.00)
Globulin: 2.3 g/dL (calc) (ref 1.9–3.7)
Glucose, Bld: 85 mg/dL (ref 65–99)
Potassium: 4.2 mmol/L (ref 3.5–5.3)
Sodium: 141 mmol/L (ref 135–146)
Total Bilirubin: 0.5 mg/dL (ref 0.2–1.2)
Total Protein: 6.3 g/dL (ref 6.1–8.1)
eGFR: 74 mL/min/{1.73_m2} (ref >=60)

## 2020-12-14 LAB — CBC WITH DIFFERENTIAL/PLATELET
Absolute Monocytes: 470 cells/uL (ref 200–950)
Basophils Absolute: 29 cells/uL (ref 0–200)
Basophils Relative: 0.6 %
Eosinophils Absolute: 260 cells/uL (ref 15–500)
Eosinophils Relative: 5.3 %
HCT: 38 % (ref 35.0–45.0)
Hemoglobin: 12.3 g/dL (ref 11.7–15.5)
Lymphs Abs: 1877 cells/uL (ref 850–3900)
MCH: 29.4 pg (ref 27.0–33.0)
MCHC: 32.4 g/dL (ref 32.0–36.0)
MCV: 90.7 fL (ref 80.0–100.0)
MPV: 11.6 fL (ref 7.5–12.5)
Monocytes Relative: 9.6 %
Neutro Abs: 2264 cells/uL (ref 1500–7800)
Neutrophils Relative %: 46.2 %
Platelets: 243 10*3/uL (ref 140–400)
RBC: 4.19 10*6/uL (ref 3.80–5.10)
RDW: 12.8 % (ref 11.0–15.0)
Total Lymphocyte: 38.3 %
WBC: 4.9 10*3/uL (ref 3.8–10.8)

## 2020-12-18 ENCOUNTER — Telehealth: Payer: Self-pay

## 2020-12-18 ENCOUNTER — Ambulatory Visit: Payer: No Typology Code available for payment source

## 2020-12-18 NOTE — Telephone Encounter (Signed)
Made in error

## 2020-12-20 ENCOUNTER — Ambulatory Visit
Admission: RE | Admit: 2020-12-20 | Discharge: 2020-12-20 | Disposition: A | Discharge status: Home / Self Care | Payer: Medicare Other | Source: Ambulatory Visit | Attending: Family Medicine | Admitting: Family Medicine

## 2020-12-20 ENCOUNTER — Other Ambulatory Visit: Payer: Self-pay

## 2020-12-20 ENCOUNTER — Ambulatory Visit: Payer: No Typology Code available for payment source

## 2020-12-20 DIAGNOSIS — M79605 Pain in left leg: Secondary | ICD-10-CM

## 2020-12-20 DIAGNOSIS — R0989 Other specified symptoms and signs involving the circulatory and respiratory systems: Secondary | ICD-10-CM

## 2020-12-20 DIAGNOSIS — I10 Essential (primary) hypertension: Secondary | ICD-10-CM | POA: Diagnosis not present

## 2020-12-20 DIAGNOSIS — I6523 Occlusion and stenosis of bilateral carotid arteries: Secondary | ICD-10-CM | POA: Diagnosis not present

## 2020-12-20 DIAGNOSIS — R2681 Unsteadiness on feet: Secondary | ICD-10-CM | POA: Diagnosis not present

## 2020-12-20 DIAGNOSIS — M6281 Muscle weakness (generalized): Secondary | ICD-10-CM

## 2020-12-20 DIAGNOSIS — E785 Hyperlipidemia, unspecified: Secondary | ICD-10-CM | POA: Diagnosis not present

## 2020-12-20 NOTE — Patient Instructions (Signed)
  [id number]

## 2020-12-20 NOTE — Therapy (Signed)
[place], [place], [address] Phone: [phone]   Fax:  [phone]  Physical Therapy Treatment  Patient Details  Name: Cheryl Skinner MRN: [id number] Date of Birth: [birth year] Referring Provider (PT): Rosemarie Ax, MD   Encounter Date: 12/20/2020   PT End of Session - 12/20/20 1740     Visit Number 7    Number of Visits 13    Date for PT Re-Evaluation 12/27/20    Authorization Type UHC MCR; FOTO on visit 6 and 10    Progress Note Due on Visit 10    PT Start Time 1700    PT Stop Time 1745    PT Time Calculation (min) 45 min    Activity Tolerance Patient tolerated treatment well    Behavior During Therapy [facility] for tasks assessed/performed             Past Medical History:  Diagnosis Date   Hyperlipidemia    Hypertension    Osteopenia    T -2.4 in hip 2021   TMJ (temporomandibular joint syndrome)     Past Surgical History:  Procedure Laterality Date   ABDOMINAL HYSTERECTOMY      There were no vitals filed for this visit.   Subjective Assessment - 12/20/20 1709     Subjective Pt reports mild pain in her L hamstring region starting yesterday that she describes as an "irritation." She attributes the increased pain to long car rides the past few days. She reports being adherent to her HEP.    Currently in Pain? Yes    Pain Score 1     Pain Location Leg    Pain Orientation Left;Posterior;Upper    Pain Descriptors / Indicators Aching                               [facility] Adult PT Treatment/Exercise - 12/20/20 0001       Knee/Hip Exercises: Stretches   Other Knee/Hip Stretches Seated hamstring stretch 2x1 min BIL      Knee/Hip Exercises: Aerobic   Other Aerobic Boxer shuffle 2x30sec      Knee/Hip Exercises: Standing   Forward Lunges Other (comment)   Walking lunges in // bars x4 laps   Side Lunges Other (comment)   in // bars x4 laps     Knee/Hip Exercises:  Supine   Other Supine Knee/Hip Exercises Hip thrust on edge of table 2x10 with 3-sec hold      Knee/Hip Exercises: Prone   Other Prone Exercises Nordic hamstring curl with Green pull-up band assistance 2x10                    PT Education - 12/20/20 1738     Education Details Instructed pt on proper form when performing new exercises.    Person(s) Educated Patient    Methods Explanation;Demonstration;Handout    Comprehension Verbalized understanding;Returned demonstration              PT Short Term Goals - 11/15/20 1734       PT SHORT TERM GOAL #1   Title Pt will demonstrate understanding and adherence to HEP in order to achieve independence with management of her primary impairments.    Baseline HEP given at evaluation    Time 3    Period Weeks    Status New    Target Date 12/06/20  PT Long Term Goals - 11/15/20 1740       PT LONG TERM GOAL #1   Title Pt will demonstrate L knee flexion MMT of 4/5 or greater in order to achieve gait efficiency with proper balance.    Baseline 3/5    Time 6    Period Weeks    Status New    Target Date 12/27/20      PT LONG TERM GOAL #2   Title Pt will achieve FOTO score of 72% or higher in order to demonstrate improved functional ability as it relates to her hamstring injury,    Baseline 53%    Time 6    Period Weeks    Status New    Target Date 12/27/20      PT LONG TERM GOAL #3   Title Pt will achieve BIL global hip strength of 5/5 in order to progress independent LE strengthening without limitation.    Baseline BIL hip abduction 4/5, L hip flexion 4/5    Time 6    Period Weeks    Status New    Target Date 12/27/20      PT LONG TERM GOAL #4   Title Pt will report ability to wash her legs and shave in the shower independently with 0/10 pain in order to promote personal hygeine.    Baseline Pt requires daughter's help to wash legs and shave in the shower.    Time 6    Period Weeks    Status  New    Target Date 12/27/20                   Plan - 12/20/20 1740     Clinical Impression Statement Pt responded well to all treatment today, demonstrating proper form and no increase in pain with her new exercises. She reports no change in pain at the end of the session. Rehab protocol from [facility] in being utilized during the pt's POC. She will continue to benefit from skilled PT to address her primary impairments and return to her prior level of function without limitation.    Personal Factors and Comorbidities Age;Comorbidity 2    Comorbidities hyperlipodemia, hypertension    Examination-Activity Limitations Bathing;Dressing;Sit;Transfers;Bend;Stairs    Examination-Participation Restrictions Laundry;Cleaning;Driving    Stability/Clinical Decision Making Stable/Uncomplicated    Clinical Decision Making Low    Rehab Potential Good    PT Frequency 2x / week    PT Duration 6 weeks    PT Treatment/Interventions ADLs/Self Care Home Management;Aquatic Therapy;Biofeedback;Moist Heat;Cryotherapy;DME Instruction;Therapeutic activities;Functional mobility training;Stair training;Gait training;Therapeutic exercise;Balance training;Neuromuscular re-education;Compression bandaging;Manual lymph drainage;Manual techniques;Passive range of motion;Patient/family education;Taping;Joint Manipulations    PT Next Visit Plan Assess pt's balance, LE sensation, progress hamstring/ hip strengthening according to [facility] protocol    PT Home Exercise Plan [id number]    Consulted and Agree with Plan of Care Patient             Patient will benefit from skilled therapeutic intervention in order to improve the following deficits and impairments:  Abnormal gait, Decreased activity tolerance, Decreased balance, Decreased endurance, Decreased mobility, Decreased range of motion, Decreased strength, Hypomobility, Increased edema, Impaired flexibility, Pain, Impaired sensation  Visit  Diagnosis: Pain in left leg  Unsteadiness on feet  Muscle weakness (generalized)     Problem List Patient Active Problem List   Diagnosis Date Noted   Rupture of left hamstring tendon 11/02/2020   Osteopenia    Hyperlipidemia    Hypertension  TMJ (temporomandibular joint syndrome)     Vanessa Solano, PT, DPT 12/20/20 5:42 PM   [facility] [address] [place], [place], [address] Phone: [phone]   Fax:  [phone]  Name: BAHATI ZETTEL MRN: [id number] Date of Birth: 05-22-1946

## 2020-12-22 ENCOUNTER — Other Ambulatory Visit: Payer: Self-pay

## 2020-12-22 ENCOUNTER — Ambulatory Visit: Payer: No Typology Code available for payment source

## 2020-12-22 DIAGNOSIS — M6281 Muscle weakness (generalized): Secondary | ICD-10-CM

## 2020-12-22 DIAGNOSIS — R2681 Unsteadiness on feet: Secondary | ICD-10-CM | POA: Diagnosis not present

## 2020-12-22 DIAGNOSIS — M79605 Pain in left leg: Secondary | ICD-10-CM

## 2020-12-22 NOTE — Patient Instructions (Signed)
Pt educated to perform HEP to tolerance the next few days until her next appointment. Also advised to abstain from any vigorous activity with the goal of DOMS management and pain reduction.

## 2020-12-22 NOTE — Therapy (Signed)
[place], [place], [address] Phone: [phone]   Fax:  [phone]  Physical Therapy Treatment  Patient Details  Name: Cheryl Skinner MRN: [id number] Date of Birth: 1945/10/17 Referring Provider (PT): Rosemarie Ax, MD   Encounter Date: 12/22/2020   PT End of Session - 12/22/20 1033     Visit Number 8    Number of Visits 13    Date for PT Re-Evaluation 12/27/20    Authorization Type UHC MCR; FOTO on visit 6 and 10    Progress Note Due on Visit 10    PT Start Time 1000    PT Stop Time 1045    PT Time Calculation (min) 45 min    Activity Tolerance Patient tolerated treatment well    Behavior During Therapy [facility] for tasks assessed/performed             Past Medical History:  Diagnosis Date   Hyperlipidemia    Hypertension    Osteopenia    T -2.4 in hip 2021   TMJ (temporomandibular joint syndrome)     Past Surgical History:  Procedure Laterality Date   ABDOMINAL HYSTERECTOMY      There were no vitals filed for this visit.   Subjective Assessment - 12/22/20 1010     Subjective Pt reports increased quad soreness since her last appointment on Wednesday, which she attributes to doing her lunges. She has still been performing her HEP despite the increased pain.    Currently in Pain? Yes    Pain Score 5     Pain Location Leg    Pain Orientation Left;Anterior;Upper    Pain Descriptors / Indicators Aching;Sore                               [facility] Adult PT Treatment/Exercise - 12/22/20 0001       Knee/Hip Exercises: Aerobic   Recumbent Bike 27mn at self selected pace      Knee/Hip Exercises: Supine   Other Supine Knee/Hip Exercises SLR pulldown with YTB 2x10 BIL      Knee/Hip Exercises: Prone   Other Prone Exercises Hamstring curls with 2# ankle weights with slow, eccentric lowering 2x10      Moist Heat Therapy   Number Minutes Moist Heat 10 Minutes    Moist  Heat Location Other (comment)   BIL quads     Manual Therapy   Manual Therapy Soft tissue mobilization    Soft tissue mobilization Efflourage to BIL quads for pain reduction                    PT Education - 12/22/20 1045     Education Details Pt educated to perform HEP to tolerance the next few days until her next appointment. Also advised to abstain from any vigorous activity with the goal of DOMS management and pain reduction.    Person(s) Educated Patient    Methods Explanation    Comprehension Verbalized understanding              PT Short Term Goals - 11/15/20 1734       PT SHORT TERM GOAL #1   Title Pt will demonstrate understanding and adherence to HEP in order to achieve independence with management of her primary impairments.    Baseline HEP given at evaluation    Time 3    Period Weeks    Status New  Target Date 12/06/20               PT Long Term Goals - 11/15/20 1740       PT LONG TERM GOAL #1   Title Pt will demonstrate L knee flexion MMT of 4/5 or greater in order to achieve gait efficiency with proper balance.    Baseline 3/5    Time 6    Period Weeks    Status New    Target Date 12/27/20      PT LONG TERM GOAL #2   Title Pt will achieve FOTO score of 72% or higher in order to demonstrate improved functional ability as it relates to her hamstring injury,    Baseline 53%    Time 6    Period Weeks    Status New    Target Date 12/27/20      PT LONG TERM GOAL #3   Title Pt will achieve BIL global hip strength of 5/5 in order to progress independent LE strengthening without limitation.    Baseline BIL hip abduction 4/5, L hip flexion 4/5    Time 6    Period Weeks    Status New    Target Date 12/27/20      PT LONG TERM GOAL #4   Title Pt will report ability to wash her legs and shave in the shower independently with 0/10 pain in order to promote personal hygeine.    Baseline Pt requires daughter's help to wash legs and shave in  the shower.    Time 6    Period Weeks    Status New    Target Date 12/27/20                   Plan - 12/22/20 1034     Clinical Impression Statement Pt arrives in increase quad soreness today, which limited the types of exercises which could be performed today. She responded well to all treatment today, demonstrating proper form and no increase in pain with her new exercises. She reports improved pain following moist heat and manual therapy. She states she feels "much better" and rates her pain as 1/10. Rehab protocol from [place] [facility] in being utilized during the pt's POC. She will continue to benefit from skilled PT to address her primary impairments and return to her prior level of function without limitation.    Personal Factors and Comorbidities Age;Comorbidity 2    Comorbidities hyperlipodemia, hypertension    Examination-Activity Limitations Bathing;Dressing;Sit;Transfers;Bend;Stairs    Examination-Participation Restrictions Laundry;Cleaning;Driving    Stability/Clinical Decision Making Stable/Uncomplicated    Clinical Decision Making Low    Rehab Potential Good    PT Frequency 2x / week    PT Duration 6 weeks    PT Treatment/Interventions ADLs/Self Care Home Management;Aquatic Therapy;Biofeedback;Moist Heat;Cryotherapy;DME Instruction;Therapeutic activities;Functional mobility training;Stair training;Gait training;Therapeutic exercise;Balance training;Neuromuscular re-education;Compression bandaging;Manual lymph drainage;Manual techniques;Passive range of motion;Patient/family education;Taping;Joint Manipulations    PT Next Visit Plan Assess pt's response tot reatment, balance, LE sensation, progress hamstring/ hip strengthening according to [facility] protocol    PT Home Exercise Plan [id number]    Consulted and Agree with Plan of Care Patient             Patient will benefit from skilled therapeutic intervention in order to improve the following  deficits and impairments:  Abnormal gait, Decreased activity tolerance, Decreased balance, Decreased endurance, Decreased mobility, Decreased range of motion, Decreased strength, Hypomobility, Increased edema, Impaired flexibility, Pain, Impaired sensation  Visit Diagnosis:  Pain in left leg  Unsteadiness on feet  Muscle weakness (generalized)     Problem List Patient Active Problem List   Diagnosis Date Noted   Rupture of left hamstring tendon 11/02/2020   Osteopenia    Hyperlipidemia    Hypertension    TMJ (temporomandibular joint syndrome)     Vanessa Bly[place], PT, DPT 12/22/20 11:00 AM   [facility] [address] [place], [place], [address] Phone: [phone]   Fax:  [phone]  Name: Cheryl Skinner MRN: [id number] Date of Birth: [birth year]

## 2020-12-28 ENCOUNTER — Ambulatory Visit: Payer: No Typology Code available for payment source | Attending: Family Medicine

## 2020-12-28 ENCOUNTER — Other Ambulatory Visit: Payer: Self-pay

## 2020-12-28 DIAGNOSIS — M6281 Muscle weakness (generalized): Secondary | ICD-10-CM | POA: Insufficient documentation

## 2020-12-28 DIAGNOSIS — M79605 Pain in left leg: Secondary | ICD-10-CM | POA: Diagnosis present

## 2020-12-28 DIAGNOSIS — R2681 Unsteadiness on feet: Secondary | ICD-10-CM | POA: Insufficient documentation

## 2020-12-28 NOTE — Patient Instructions (Signed)
  [id number]

## 2020-12-28 NOTE — Therapy (Signed)
[place], [place], [address] Phone: [phone]   Fax:  [phone]  Physical Therapy Treatment/ Re-certification  Progress Note Reporting Period 11/15/2020 to 12/28/2020  See note below for Objective Data and Assessment of Progress/Goals.      Patient Details  Name: Cheryl Skinner MRN: [id number] Date of Birth: [birth year] Referring Provider (PT): Rosemarie Ax, MD   Encounter Date: 12/28/2020   PT End of Session - 12/28/20 1528     Visit Number 9    Number of Visits 13    Date for PT Re-Evaluation 01/11/21    Authorization Type UHC MCR; FOTO on visit 6 and 10    Progress Note Due on Visit 10    PT Start Time 5625    PT Stop Time 1530    PT Time Calculation (min) 45 min    Activity Tolerance Patient tolerated treatment well    Behavior During Therapy [facility] for tasks assessed/performed             Past Medical History:  Diagnosis Date   Hyperlipidemia    Hypertension    Osteopenia    T -2.4 in hip 2021   TMJ (temporomandibular joint syndrome)     Past Surgical History:  Procedure Laterality Date   ABDOMINAL HYSTERECTOMY      There were no vitals filed for this visit.   Subjective Assessment - 12/28/20 1449     Subjective Pt reports feeling well today, adding that her quads feel much better compared to last week. She adds that she has been adherent to her HEP with no pain with any exercises. She reports being ready to start today's session.    Currently in Pain? Yes    Pain Score 1     Pain Location Leg    Pain Orientation Left;Posterior;Upper    Pain Descriptors / Indicators Aching                [facility] PT Assessment - 12/28/20 0001       Observation/Other Assessments   Focus on Therapeutic Outcomes (FOTO)  75%      Strength   Right Hip ABduction 4+/5    Left Hip Flexion 5/5    Left Hip ABduction 4+/5    Left Knee Flexion 5/5                            [facility] Adult PT Treatment/Exercise - 12/28/20 0001       Knee/Hip Exercises: Stretches   Other Knee/Hip Stretches Seated dynamic hamstring stretch with heel on rolling stool 2x15 with 5-sec hold      Knee/Hip Exercises: Aerobic   Recumbent Bike Level 3 resistance x5 minutes at self-selected pace    Other Aerobic Boxer shuffle 2x30sec      Knee/Hip Exercises: Supine   Bridges Strengthening   Bridge walkouts 2x8     Knee/Hip Exercises: Prone   Other Prone Exercises Hamstring curls with 4# ankle weights with slow, eccentric lowering 2x10                    PT Education - 12/28/20 1527     Education Details Reviewed HEP, along with proper form when performing new exercises    Person(s) Educated Patient    Methods Explanation;Demonstration;Handout    Comprehension Verbalized understanding;Returned demonstration              PT Short Term  Goals - 12/28/20 1535       PT SHORT TERM GOAL #1   Title Pt will demonstrate understanding and adherence to HEP in order to achieve independence with management of her primary impairments.    Baseline Pt reports daily adherence to her HEP    Time 3    Period Weeks    Status Achieved    Target Date 12/06/20               PT Long Term Goals - 12/28/20 1536       PT LONG TERM GOAL #1   Baseline 5/5 on 12/28/2020    Status Achieved      PT LONG TERM GOAL #2   Baseline 75% - 12/28/2020    Status Achieved      PT LONG TERM GOAL #3   Baseline BIL hip abduction = 4+/5    Status On-going      PT LONG TERM GOAL #4   Baseline Pt reports independence in washing and shaving her legs without pain - 12/28/2020    Status Achieved                   Plan - 12/28/20 1529     Clinical Impression Statement Pt responded well to all treatment today, demonstrating proper form and no increase in pain with all exercises. Upon re-assessment of objective data, the pt has met most of her goals of PT.  Her only remaining impairments include mild weakness in BIL hip abduction and mild baseline pain when sitting on the involved extremity. She was given the choice of being discharged today or finalizing her planned POC and she chose to finalize her POC to ensure remission of sxs before completing PT. She will continue to benefit from skilled PT to address her remaining impairments and return to her baseline of function without limitation.    Personal Factors and Comorbidities Age;Comorbidity 2    Comorbidities hyperlipodemia, hypertension    Examination-Activity Limitations Bathing;Dressing;Sit;Transfers;Bend;Stairs    Examination-Participation Restrictions Laundry;Cleaning;Driving    Stability/Clinical Decision Making Stable/Uncomplicated    Clinical Decision Making Low    Rehab Potential Good    PT Frequency 2x / week    PT Duration 2 weeks    PT Treatment/Interventions ADLs/Self Care Home Management;Aquatic Therapy;Biofeedback;Moist Heat;Cryotherapy;DME Instruction;Therapeutic activities;Functional mobility training;Stair training;Gait training;Therapeutic exercise;Balance training;Neuromuscular re-education;Compression bandaging;Manual lymph drainage;Manual techniques;Passive range of motion;Patient/family education;Taping;Joint Manipulations    PT Next Visit Plan Progress hamstring/ hip strengthening according to [facility] protocol    PT Home Exercise Plan [id number]    Consulted and Agree with Plan of Care Patient             Patient will benefit from skilled therapeutic intervention in order to improve the following deficits and impairments:  Abnormal gait, Decreased activity tolerance, Decreased balance, Decreased endurance, Decreased mobility, Decreased range of motion, Decreased strength, Hypomobility, Increased edema, Impaired flexibility, Pain, Impaired sensation  Visit Diagnosis: Pain in left leg  Unsteadiness on feet  Muscle weakness (generalized)     Problem List Patient  Active Problem List   Diagnosis Date Noted   Rupture of left hamstring tendon 11/02/2020   Osteopenia    Hyperlipidemia    Hypertension    TMJ (temporomandibular joint syndrome)     Vanessa [place], PT, DPT 12/28/20 3:40 PM   [facility] [address] [place], [place], [address] Phone: [phone]   Fax:  [phone]  Name: Cheryl Skinner MRN: [id number] Date of  Birth: 1945-06-18

## 2021-01-01 ENCOUNTER — Ambulatory Visit: Payer: No Typology Code available for payment source

## 2021-01-01 ENCOUNTER — Other Ambulatory Visit: Payer: Self-pay

## 2021-01-01 DIAGNOSIS — M6281 Muscle weakness (generalized): Secondary | ICD-10-CM

## 2021-01-01 DIAGNOSIS — R2681 Unsteadiness on feet: Secondary | ICD-10-CM

## 2021-01-01 DIAGNOSIS — M79605 Pain in left leg: Secondary | ICD-10-CM | POA: Diagnosis not present

## 2021-01-01 NOTE — Therapy (Signed)
[place], [place], [address] Phone: [phone]   Fax:  [phone]  Physical Therapy Treatment  Patient Details  Name: Cheryl Skinner MRN: [id number] Date of Birth: 04-Oct-1945 Referring Provider (PT): Rosemarie Ax, MD   Encounter Date: 01/01/2021   PT End of Session - 01/01/21 1610     Visit Number 10    Number of Visits 13    Date for PT Re-Evaluation 01/11/21    Authorization Type UHC MCR; FOTO on visit 6 and 10    Progress Note Due on Visit 10    PT Start Time 1530    PT Stop Time 1615    PT Time Calculation (min) 45 min    Activity Tolerance Patient tolerated treatment well    Behavior During Therapy [facility] for tasks assessed/performed             Past Medical History:  Diagnosis Date   Hyperlipidemia    Hypertension    Osteopenia    T -2.4 in hip 2021   TMJ (temporomandibular joint syndrome)     Past Surgical History:  Procedure Laterality Date   ABDOMINAL HYSTERECTOMY      There were no vitals filed for this visit.   Subjective Assessment - 01/01/21 1537     Subjective Pt reports increased L hamstring soreness today, which she reports is associated with increased exercise and activity the past few days. She reports continued daily HEP adherence.    Currently in Pain? Yes    Pain Score 1     Pain Location Leg    Pain Orientation Left;Posterior;Upper    Pain Descriptors / Indicators Aching;Sore    Pain Type Chronic pain                               [facility] Adult PT Treatment/Exercise - 01/01/21 0001       Knee/Hip Exercises: Stretches   Active Hamstring Stretch Other (comment)   2x61mn on L   Quad Stretch Other (comment)   2x175m chair stretch     Knee/Hip Exercises: Aerobic   Recumbent Bike Level 4 resistance x5 minutes at self-selected pace      Knee/Hip Exercises: Standing   Lateral Step Up Other (comment);Step Height: 6"   x10 BIL      Knee/Hip Exercises: Supine   BrEngineer, miningalkouts 2x8     Knee/Hip Exercises: Prone   Other Prone Exercises Hamstring curls with 5# ankle weights with slow, eccentric lowering 2x10                    PT Education - 01/01/21 1609     Education Details Educated on proper form with HEP. Also educated on typical timeline for full recovery from hamstring injuries, discussing rehab variables and contributing factors.    Person(s) Educated Patient    Methods Explanation;Demonstration;Handout    Comprehension Verbalized understanding;Returned demonstration              PT Short Term Goals - 12/28/20 1535       PT SHORT TERM GOAL #1   Title Pt will demonstrate understanding and adherence to HEP in order to achieve independence with management of her primary impairments.    Baseline Pt reports daily adherence to her HEP    Time 3    Period Weeks    Status Achieved    Target Date  12/06/20               PT Long Term Goals - 12/28/20 1536       PT LONG TERM GOAL #1   Baseline 5/5 on 12/28/2020    Status Achieved      PT LONG TERM GOAL #2   Baseline 75% - 12/28/2020    Status Achieved      PT LONG TERM GOAL #3   Baseline BIL hip abduction = 4+/5    Status On-going      PT LONG TERM GOAL #4   Baseline Pt reports independence in washing and shaving her legs without pain - 12/28/2020    Status Achieved                   Plan - 01/01/21 1617     Clinical Impression Statement Pt responded well to all treatment today, demonstrating proper form and no increase in pain with all exercises. She continues to demonstrate good functional hamstring strength and LE mobility. She will continue to benefit from skilled PT to address her remaining impairments and return to her baseline of function without limitation.    Personal Factors and Comorbidities Age;Comorbidity 2    Comorbidities hyperlipodemia, hypertension    Examination-Activity Limitations  Bathing;Dressing;Sit;Transfers;Bend;Stairs    Examination-Participation Restrictions Laundry;Cleaning;Driving    Stability/Clinical Decision Making Stable/Uncomplicated    Clinical Decision Making Low    Rehab Potential Good    PT Frequency 2x / week    PT Duration 2 weeks    PT Treatment/Interventions ADLs/Self Care Home Management;Aquatic Therapy;Biofeedback;Moist Heat;Cryotherapy;DME Instruction;Therapeutic activities;Functional mobility training;Stair training;Gait training;Therapeutic exercise;Balance training;Neuromuscular re-education;Compression bandaging;Manual lymph drainage;Manual techniques;Passive range of motion;Patient/family education;Taping;Joint Manipulations    PT Next Visit Plan Progress hamstring/ hip strengthening according to [facility] protocol    PT Home Exercise Plan [id number]    Consulted and Agree with Plan of Care Patient             Patient will benefit from skilled therapeutic intervention in order to improve the following deficits and impairments:  Abnormal gait, Decreased activity tolerance, Decreased balance, Decreased endurance, Decreased mobility, Decreased range of motion, Decreased strength, Hypomobility, Increased edema, Impaired flexibility, Pain, Impaired sensation  Visit Diagnosis: Pain in left leg  Unsteadiness on feet  Muscle weakness (generalized)     Problem List Patient Active Problem List   Diagnosis Date Noted   Rupture of left hamstring tendon 11/02/2020   Osteopenia    Hyperlipidemia    Hypertension    TMJ (temporomandibular joint syndrome)     Vanessa Box, PT, DPT 01/01/21 4:19 PM   [place] [facility] [address] [place], [place], [address] Phone: [phone]   Fax:  [phone]  Name: Cheryl Skinner MRN: [id number] Date of Birth: Jan 30, 1946

## 2021-01-01 NOTE — Patient Instructions (Signed)
Reviewed HEP with pt.

## 2021-01-03 ENCOUNTER — Other Ambulatory Visit: Payer: Self-pay

## 2021-01-03 ENCOUNTER — Ambulatory Visit: Payer: No Typology Code available for payment source

## 2021-01-03 DIAGNOSIS — M79605 Pain in left leg: Secondary | ICD-10-CM | POA: Diagnosis not present

## 2021-01-03 DIAGNOSIS — R2681 Unsteadiness on feet: Secondary | ICD-10-CM

## 2021-01-03 DIAGNOSIS — M6281 Muscle weakness (generalized): Secondary | ICD-10-CM

## 2021-01-03 NOTE — Patient Instructions (Signed)
Reviewed HEP with pt, focusing on proper form.

## 2021-01-03 NOTE — Therapy (Signed)
[place], [place], [address] Phone: [phone]   Fax:  [phone]  Physical Therapy Treatment  Patient Details  Name: Cheryl Skinner MRN: [id number] Date of Birth: [birth year] Referring Provider (PT): Rosemarie Ax, MD   Encounter Date: 01/03/2021   PT End of Session - 01/03/21 1640     Visit Number 11    Number of Visits 13    Date for PT Re-Evaluation 01/11/21    Authorization Type UHC MCR; FOTO on visit 6 and 10    Progress Note Due on Visit 10    PT Start Time 1615    PT Stop Time 1700    PT Time Calculation (min) 45 min    Activity Tolerance Patient tolerated treatment well    Behavior During Therapy [facility] for tasks assessed/performed             Past Medical History:  Diagnosis Date   Hyperlipidemia    Hypertension    Osteopenia    T -2.4 in hip 2021   TMJ (temporomandibular joint syndrome)     Past Surgical History:  Procedure Laterality Date   ABDOMINAL HYSTERECTOMY      There were no vitals filed for this visit.   Subjective Assessment - 01/03/21 1617     Subjective Pt reports feeling well today, only having pain when sitting on her L buttock. She reports daily adherence to her HEP, adding that her exercises have been going well.    Currently in Pain? No/denies    Pain Score 0-No pain                               [facility] Adult PT Treatment/Exercise - 01/03/21 0001       Knee/Hip Exercises: Aerobic   Other Aerobic Skipping 3x26f laps      Knee/Hip Exercises: Standing   Forward Step Up Both;Step Height: 4";2 sets;10 reps   With knee drive   Other Standing Knee Exercises SL dead lift with 10# KB with UE support 2x10 BIL      Knee/Hip Exercises: Supine   Bridges Other (comment);Strengthening   SL bridge with march 2x10 BIL     Knee/Hip Exercises: Prone   Other Prone Exercises Hamstring curls with 5# ankle weights with slow, eccentric lowering  2x10      Moist Heat Therapy   Number Minutes Moist Heat 10 Minutes    Moist Heat Location Other (comment)   L hamstrings                   PT Education - 01/03/21 1639     Education Details Educated on proper form with HEP.    Person(s) Educated Patient    Methods Explanation;Demonstration    Comprehension Verbalized understanding;Returned demonstration              PT Short Term Goals - 12/28/20 1535       PT SHORT TERM GOAL #1   Title Pt will demonstrate understanding and adherence to HEP in order to achieve independence with management of her primary impairments.    Baseline Pt reports daily adherence to her HEP    Time 3    Period Weeks    Status Achieved    Target Date 12/06/20               PT Long Term Goals - 12/28/20 1536  PT LONG TERM GOAL #1   Baseline 5/5 on 12/28/2020    Status Achieved      PT LONG TERM GOAL #2   Baseline 75% - 12/28/2020    Status Achieved      PT LONG TERM GOAL #3   Baseline BIL hip abduction = 4+/5    Status On-going      PT LONG TERM GOAL #4   Baseline Pt reports independence in washing and shaving her legs without pain - 12/28/2020    Status Achieved                   Plan - 01/03/21 1641     Clinical Impression Statement Pt responded well to all treatment today, demonstrating proper form and no increase in pain with all exercises. She continues to demonstrate good functional hamstring strength and LE mobility. She will continue to benefit from skilled PT to address her remaining impairments and return to her baseline of function without limitation.    Personal Factors and Comorbidities Age;Comorbidity 2    Comorbidities hyperlipodemia, hypertension    Examination-Activity Limitations Bathing;Dressing;Sit;Transfers;Bend;Stairs    Examination-Participation Restrictions Laundry;Cleaning;Driving    Stability/Clinical Decision Making Stable/Uncomplicated    Clinical Decision Making Low    Rehab  Potential Good    PT Frequency 2x / week    PT Duration 2 weeks    PT Treatment/Interventions ADLs/Self Care Home Management;Aquatic Therapy;Biofeedback;Moist Heat;Cryotherapy;DME Instruction;Therapeutic activities;Functional mobility training;Stair training;Gait training;Therapeutic exercise;Balance training;Neuromuscular re-education;Compression bandaging;Manual lymph drainage;Manual techniques;Passive range of motion;Patient/family education;Taping;Joint Manipulations    PT Next Visit Plan Progress hamstring/ hip strengthening according to [facility] protocol    PT Home Exercise Plan [id number]    Consulted and Agree with Plan of Care Patient             Patient will benefit from skilled therapeutic intervention in order to improve the following deficits and impairments:  Abnormal gait, Decreased activity tolerance, Decreased balance, Decreased endurance, Decreased mobility, Decreased range of motion, Decreased strength, Hypomobility, Increased edema, Impaired flexibility, Pain, Impaired sensation  Visit Diagnosis: Pain in left leg  Unsteadiness on feet  Muscle weakness (generalized)     Problem List Patient Active Problem List   Diagnosis Date Noted   Rupture of left hamstring tendon 11/02/2020   Osteopenia    Hyperlipidemia    Hypertension    TMJ (temporomandibular joint syndrome)     Vanessa [place], PT, DPT 01/03/21 4:52 PM   [facility] [address] [place], [place], [address] Phone: [phone]   Fax:  [phone]  Name: NATALYNN KORTAN MRN: [id number] Date of Birth: Mar 07, 1946

## 2021-01-08 ENCOUNTER — Other Ambulatory Visit: Payer: Self-pay

## 2021-01-08 ENCOUNTER — Ambulatory Visit: Payer: No Typology Code available for payment source

## 2021-01-08 DIAGNOSIS — R2681 Unsteadiness on feet: Secondary | ICD-10-CM

## 2021-01-08 DIAGNOSIS — M79605 Pain in left leg: Secondary | ICD-10-CM | POA: Diagnosis not present

## 2021-01-08 DIAGNOSIS — M6281 Muscle weakness (generalized): Secondary | ICD-10-CM

## 2021-01-08 NOTE — Patient Instructions (Signed)
Pt instructed to continue her previously prescribed exercises, progressing resistance as tolerated.

## 2021-01-08 NOTE — Therapy (Signed)
[place], [place], [address] Phone: [phone]   Fax:  [phone]  Physical Therapy Treatment  Patient Details  Name: Cheryl Skinner MRN: [id number] Date of Birth: 04/22/1946 Referring Provider (PT): Rosemarie Ax, MD   Encounter Date: 01/08/2021   PT End of Session - 01/08/21 1641     Visit Number 12    Number of Visits 13    Date for PT Re-Evaluation 01/11/21    Authorization Type UHC MCR; FOTO on visit 6 and 10    Progress Note Due on Visit 10    PT Start Time 1615    PT Stop Time 1700    PT Time Calculation (min) 45 min    Equipment Utilized During Treatment Gait belt    Activity Tolerance Patient tolerated treatment well    Behavior During Therapy WFL for tasks assessed/performed             Past Medical History:  Diagnosis Date   Hyperlipidemia    Hypertension    Osteopenia    T -2.4 in hip 2021   TMJ (temporomandibular joint syndrome)     Past Surgical History:  Procedure Laterality Date   ABDOMINAL HYSTERECTOMY      There were no vitals filed for this visit.   Subjective Assessment - 01/08/21 1624     Subjective Pt reports feeling well today, only having pain when sitting on her L buttock. She reports continued daily adherence to her HEP, adding that her exercises have been going well. She adds that she has a follow-up appointment on Thursday regarding her hamstring rehab progression.    Currently in Pain? Yes    Pain Score 1     Pain Location Leg    Pain Orientation Left;Posterior;Upper    Pain Descriptors / Indicators Aching;Sore    Pain Type Chronic pain    Pain Onset More than a month ago    Pain Frequency Intermittent                               [facility] Adult PT Treatment/Exercise - 01/08/21 0001       Knee/Hip Exercises: Stretches   Active Hamstring Stretch 60 seconds      Knee/Hip Exercises: Plyometrics   Bilateral Jumping Other  (comment)   DL bounding 3x5     Knee/Hip Exercises: Standing   Lateral Step Up Other (comment);Step Height: 6"    Lateral Step Up Limitations 2x10 BIL    Forward Step Up Both;Step Height: 4";2 sets;10 reps    Other Standing Knee Exercises 10# KB swings 2x12    Other Standing Knee Exercises SL hops 2x10 BIL      Knee/Hip Exercises: Supine   Bridges Other (comment);Strengthening   Bridge Walkouts 2x10 BIL                   PT Education - 01/08/21 1641     Education Details Educated on proper form with HEP.    Person(s) Educated Patient    Methods Explanation;Demonstration    Comprehension Verbalized understanding;Returned demonstration              PT Short Term Goals - 12/28/20 1535       PT SHORT TERM GOAL #1   Title Pt will demonstrate understanding and adherence to HEP in order to achieve independence with management of her primary impairments.    Baseline Pt  reports daily adherence to her HEP    Time 3    Period Weeks    Status Achieved    Target Date 12/06/20               PT Long Term Goals - 12/28/20 1536       PT LONG TERM GOAL #1   Baseline 5/5 on 12/28/2020    Status Achieved      PT LONG TERM GOAL #2   Baseline 75% - 12/28/2020    Status Achieved      PT LONG TERM GOAL #3   Baseline BIL hip abduction = 4+/5    Status On-going      PT LONG TERM GOAL #4   Baseline Pt reports independence in washing and shaving her legs without pain - 12/28/2020    Status Achieved                   Plan - 01/08/21 1648     Clinical Impression Statement Pt responded well to all treatment today, demonstrating proper form and no increase in pain with all exercises. She continues to demonstrate good functional hamstring strength and LE mobility, showing the ability to perform WNL broad jumping and SL hopping. She will continue to benefit from skilled PT to address her remaining impairments and return to her baseline of function without limitation.     Personal Factors and Comorbidities Age;Comorbidity 2    Comorbidities hyperlipodemia, hypertension    Examination-Activity Limitations Bathing;Dressing;Sit;Transfers;Bend;Stairs    Examination-Participation Restrictions Laundry;Cleaning;Driving    Stability/Clinical Decision Making Stable/Uncomplicated    Clinical Decision Making Low    Rehab Potential Good    PT Frequency 2x / week    PT Duration 2 weeks    PT Treatment/Interventions ADLs/Self Care Home Management;Aquatic Therapy;Biofeedback;Moist Heat;Cryotherapy;DME Instruction;Therapeutic activities;Functional mobility training;Stair training;Gait training;Therapeutic exercise;Balance training;Neuromuscular re-education;Compression bandaging;Manual lymph drainage;Manual techniques;Passive range of motion;Patient/family education;Taping;Joint Manipulations    PT Next Visit Plan Progress hamstring/ hip strengthening according to [facility] protocol    PT Home Exercise Plan [id number]    Consulted and Agree with Plan of Care Patient             Patient will benefit from skilled therapeutic intervention in order to improve the following deficits and impairments:  Abnormal gait, Decreased activity tolerance, Decreased balance, Decreased endurance, Decreased mobility, Decreased range of motion, Decreased strength, Hypomobility, Increased edema, Impaired flexibility, Pain, Impaired sensation  Visit Diagnosis: Pain in left leg  Unsteadiness on feet  Muscle weakness (generalized)     Problem List Patient Active Problem List   Diagnosis Date Noted   Rupture 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 [place], [place], [address] Phone: [phone]   Fax:  [phone]  Name: Cheryl Skinner MRN: [id number] Date of Birth:  06/08/1945

## 2021-01-10 ENCOUNTER — Ambulatory Visit: Payer: No Typology Code available for payment source

## 2021-01-10 ENCOUNTER — Other Ambulatory Visit: Payer: Self-pay

## 2021-01-10 DIAGNOSIS — M6281 Muscle weakness (generalized): Secondary | ICD-10-CM

## 2021-01-10 DIAGNOSIS — R2681 Unsteadiness on feet: Secondary | ICD-10-CM

## 2021-01-10 DIAGNOSIS — M79605 Pain in left leg: Secondary | ICD-10-CM

## 2021-01-10 NOTE — Patient Instructions (Signed)
Reviewed entire HEP with pt, discontinuing redundant or obsolete exercises and progressing newer exercises. Pt instructed to progress HEP independently.

## 2021-01-10 NOTE — Therapy (Signed)
[place], [place], [address] Phone: [phone]   Fax:  [phone]  Physical Therapy Treatment/ Discharge Summary  Patient Details  Name: Cheryl Skinner MRN: [id number] Date of Birth: 21-Apr-1946 Referring Provider (PT): Rosemarie Ax, MD   Encounter Date: 01/10/2021   PT End of Session - 01/10/21 1639     Visit Number 13    Number of Visits 13    Authorization Type [employer] MCR; FOTO on visit 6 and 10    PT Start Time 5462    PT Stop Time 1640    PT Time Calculation (min) 25 min    Activity Tolerance Patient tolerated treatment well    Behavior During Therapy [facility] for tasks assessed/performed             Past Medical History:  Diagnosis Date   Hyperlipidemia    Hypertension    Osteopenia    T -2.4 in hip 2021   TMJ (temporomandibular joint syndrome)     Past Surgical History:  Procedure Laterality Date   ABDOMINAL HYSTERECTOMY      There were no vitals filed for this visit.   Subjective Assessment - 01/10/21 1619     Subjective Pt reports feeling like she may have stretched her hamstring "a little too too much last week." She also reports continued minor pain when sitting on her L side. However, she reports her overall sxs since starting PT have improved. She adds that she has been adherent to her HEP, performing her exercises daily.    How long can you sit comfortably? 30 minutes    How long can you stand comfortably? Unlimited    How long can you walk comfortably? Unlimited    Diagnostic tests 11/06/2020 MRI of L Femur without contrast: IMPRESSION:  Complete rupture of the proximal hamstring tendons from their  insertion on the ischial tuberosity, with differentially retracted  tendons measuring 6.1 and 8.6 cm. Large surrounding hematoma  measuring 17.7 x 6.7 x 6.7 cm. Reactive intramuscular edema in the  distal hamstring muscles, the adductors and gluteus maximus.     Partially visualized  moderate size Baker cyst of the left knee.    Patient Stated Goals Return to doing daily activities like showering, shaving, and doing household chores without pain.    Currently in Pain? Yes    Pain Score 1     Pain Location Leg    Pain Orientation Left;Posterior;Upper    Pain Descriptors / Indicators Aching;Sore    Pain Type Chronic pain    Pain Onset More than a month ago    Pain Frequency Intermittent                [facility] PT Assessment - 01/10/21 0001       Strength   Right Hip ABduction 5/5    Left Hip ABduction 5/5                                   PT Education - 01/10/21 1638     Education Details Educated on proper form and how to progress HEP independently. Re-printed HEP for pt. Also educated pt on significance of objective progress made throughout her rehab.    Person(s) Educated Patient    Methods Explanation;Demonstration;Handout    Comprehension Verbalized understanding;Returned demonstration              PT Short Term  Goals - 12/28/20 1535       PT SHORT TERM GOAL #1   Title Pt will demonstrate understanding and adherence to HEP in order to achieve independence with management of her primary impairments.    Baseline Pt reports daily adherence to her HEP    Time 3    Period Weeks    Status Achieved    Target Date 12/06/20               PT Long Term Goals - 01/10/21 1625       PT LONG TERM GOAL #1   Title Pt will demonstrate L knee flexion MMT of 4/5 or greater in order to achieve gait efficiency with proper balance.    Baseline 5/5 on 12/28/2020    Status Achieved      PT LONG TERM GOAL #2   Title Pt will achieve FOTO score of 72% or higher in order to demonstrate improved functional ability as it relates to her hamstring injury,    Baseline 75% - 12/28/2020    Status Achieved      PT LONG TERM GOAL #3   Title Pt will achieve BIL global hip strength of 5/5 in order to progress independent LE strengthening  without limitation.    Baseline global hip strength = 5/5    Status Achieved      PT LONG TERM GOAL #4   Title Pt will report ability to wash her legs and shave in the shower independently with 0/10 pain in order to promote personal hygeine.    Baseline Pt reports independence in washing and shaving her legs without pain - 12/28/2020    Status Achieved                   Plan - 01/10/21 1640     Clinical Impression Statement Upon re-assessment, the pt has met all of her goals for PT. She reports not being functionally limited by her hamstring pain and that she feels comfortable independently progressing her HEP to further address her lingering low-level hamstring discomfort. Due to meeting her functional goals for PT and reporting feeling ready for discharge, she is discharged from PT at this time.    Personal Factors and Comorbidities Age;Comorbidity 2    Comorbidities hyperlipodemia, hypertension    Examination-Activity Limitations Bathing;Dressing;Sit;Transfers;Bend;Stairs    Examination-Participation Restrictions Laundry;Cleaning;Driving    Stability/Clinical Decision Making Stable/Uncomplicated    Clinical Decision Making Low    PT Next Visit Plan Pt is discharged from PT at this time    PT Home Exercise Plan [id number]    Consulted and Agree with Plan of Care Patient             Patient will benefit from skilled therapeutic intervention in order to improve the following deficits and impairments:  Abnormal gait, Decreased activity tolerance, Decreased balance, Decreased endurance, Decreased mobility, Decreased range of motion, Decreased strength, Hypomobility, Increased edema, Impaired flexibility, Pain, Impaired sensation  Visit Diagnosis: Pain in left leg  Unsteadiness on feet  Muscle weakness (generalized)     Problem List Patient Active Problem List   Diagnosis Date Noted   Rupture of left hamstring tendon 11/02/2020   Osteopenia    Hyperlipidemia     Hypertension    TMJ (temporomandibular joint syndrome)      [facility] [address] [place], [place], [address] Phone: [phone]   Fax:  [phone]  Name: Cheryl Skinner MRN: [id number] Date of Birth: 03-14-1946  PHYSICAL THERAPY DISCHARGE SUMMARY  Visits from Start of Care: 13  Current functional level related to goals / functional outcomes: Pt is not functionally limited by her hamstring pain. She reports having only mild (1/10) pain after 30 minutes of sitting.   Remaining deficits: Mild pain after 30 minutes of sitting   Education / Equipment: HEP/ therabands   Patient agrees to discharge. Patient goals were met. Patient is being discharged due to meeting the stated rehab goals.  Vanessa Parshall, PT, DPT 01/10/21 4:46 PM

## 2021-01-11 ENCOUNTER — Other Ambulatory Visit: Payer: Self-pay

## 2021-01-11 ENCOUNTER — Encounter: Payer: Self-pay | Admitting: Family Medicine

## 2021-01-11 ENCOUNTER — Ambulatory Visit (INDEPENDENT_AMBULATORY_CARE_PROVIDER_SITE_OTHER): Payer: Medicare Other | Admitting: Family Medicine

## 2021-01-11 DIAGNOSIS — S76312D Strain of muscle, fascia and tendon of the posterior muscle group at thigh level, left thigh, subsequent encounter: Secondary | ICD-10-CM

## 2021-01-11 NOTE — Progress Notes (Signed)
  Cheryl Skinner - 75 y.o. female MRN [id number]  Date of birth: [birth year]  SUBJECTIVE:  Including CC & ROS.  No chief complaint on file.   Cheryl Skinner is a 75 y.o. female that is following up for her hamstring pain.  She has completed physical therapy and is feeling much better.  She does get pain from time to time when she sits down   Review of Systems See HPI   HISTORY: Past Medical, Surgical, Social, and Family History Reviewed & Updated per EMR.   Pertinent Historical Findings include:  Past Medical History:  Diagnosis Date   Hyperlipidemia    Hypertension    Osteopenia    T -2.4 in hip 2021   TMJ (temporomandibular joint syndrome)     Past Surgical History:  Procedure Laterality Date   ABDOMINAL HYSTERECTOMY      History reviewed. No pertinent family history.  Social History   Socioeconomic History   Marital status: Married    Spouse name: Not on file   Number of children: Not on file   Years of education: Not on file   Highest education level: Not on file  Occupational History   Not on file  Tobacco Use   Smoking status: Former    Types: Cigarettes   Smokeless tobacco: Never  Vaping Use   Vaping Use: Never used  Substance and Sexual Activity   Alcohol use: Yes    Comment: Rare   Drug use: No   Sexual activity: Not on file  Other Topics Concern   Not on file  Social History Narrative   Not on file   Social Determinants of Health   Financial Resource Strain: Not on file  Food Insecurity: Not on file  Transportation Needs: Not on file  Physical Activity: Not on file  Stress: Not on file  Social Connections: Not on file  Intimate Partner Violence: Not on file     PHYSICAL EXAM:  VS: BP 134/77 (BP Location: Left Arm, Patient Position: Sitting, Cuff Size: Normal)   Ht 5' (1.524 m)   Wt 139 lb (63 kg)   BMI 27.15 kg/m  Physical Exam Gen: NAD, alert, cooperative with exam, well-appearing       ASSESSMENT & PLAN:   Rupture of left  hamstring tendon Having improvement since her injury.  -Counseled on home exercise therapy and supportive care. -Pursue shockwave therapy or PRP

## 2021-01-12 NOTE — Assessment & Plan Note (Signed)
Having improvement since her injury.  -Counseled on home exercise therapy and supportive care. -Pursue shockwave therapy or PRP

## 2021-01-19 ENCOUNTER — Telehealth: Payer: Self-pay

## 2021-01-19 NOTE — Telephone Encounter (Signed)
Loyal Gambler NP with [facility] called in this morning with an abnormal finding for this pt and wanted to inform the dr of these. NP stated that she performed a quantaflow test for peripheral artery disease in which she found the left leg to be abnormal with a reading of 0.83, and she would like to request a screening for the left leg. Please advise.  Cb#: [phone] Loyal Gambler NP/[employer]

## 2021-01-22 NOTE — Telephone Encounter (Signed)
Call placed to Cheryl Skinner [facility] NP.   Reports that patient quantiflow on LLE noted at 0.83. normal limit 0.9. reports that patient will require ABI's to monitor.   Call placed to patient. States that she is aware L LE is more swollen due to injury to hamstring. Reports that she is currently in PT x13 weeks for injury. States that she would like PCP to re-evaluate then.   PCP aware and agreeable to plan.

## 2021-03-21 ENCOUNTER — Telehealth: Payer: Self-pay | Admitting: Family Medicine

## 2021-03-21 NOTE — Telephone Encounter (Signed)
Left message for patient to call back and schedule Medicare Annual Wellness Visit (AWV) in office.  ° °If not able to come in office, please offer to do virtually or by telephone.  Left office number and my jabber #[phone]. ° °Due for AWVI ° °Please schedule at anytime with Nurse Health Advisor. °  °

## 2021-05-05 DIAGNOSIS — S61411A Laceration without foreign body of right hand, initial encounter: Secondary | ICD-10-CM | POA: Diagnosis not present

## 2021-05-05 DIAGNOSIS — S60221A Contusion of right hand, initial encounter: Secondary | ICD-10-CM | POA: Diagnosis not present

## 2021-05-18 DIAGNOSIS — Z4802 Encounter for removal of sutures: Secondary | ICD-10-CM | POA: Diagnosis not present

## 2021-05-23 ENCOUNTER — Telehealth: Payer: Self-pay | Admitting: Family Medicine

## 2021-05-23 NOTE — Telephone Encounter (Signed)
Left message for patient to call back and schedule Medicare Annual Wellness Visit (AWV) in office.  ° °If not able to come in office, please offer to do virtually or by telephone.  Left office number and my jabber #[phone]. ° °Due for AWVI ° °Please schedule at anytime with Nurse Health Advisor. °  °

## 2021-05-30 ENCOUNTER — Other Ambulatory Visit: Payer: Self-pay | Admitting: Dermatology

## 2021-05-30 DIAGNOSIS — D2271 Melanocytic nevi of right lower limb, including hip: Secondary | ICD-10-CM | POA: Diagnosis not present

## 2021-05-30 DIAGNOSIS — Z85828 Personal history of other malignant neoplasm of skin: Secondary | ICD-10-CM | POA: Diagnosis not present

## 2021-05-30 DIAGNOSIS — D225 Melanocytic nevi of trunk: Secondary | ICD-10-CM | POA: Diagnosis not present

## 2021-05-30 DIAGNOSIS — Z23 Encounter for immunization: Secondary | ICD-10-CM | POA: Diagnosis not present

## 2021-05-30 DIAGNOSIS — L570 Actinic keratosis: Secondary | ICD-10-CM | POA: Diagnosis not present

## 2021-05-30 DIAGNOSIS — L821 Other seborrheic keratosis: Secondary | ICD-10-CM | POA: Diagnosis not present

## 2021-05-30 DIAGNOSIS — D485 Neoplasm of uncertain behavior of skin: Secondary | ICD-10-CM | POA: Diagnosis not present

## 2021-05-30 DIAGNOSIS — L814 Other melanin hyperpigmentation: Secondary | ICD-10-CM | POA: Diagnosis not present

## 2021-05-31 LAB — DERMATOPATHOLOGY REPORT

## 2021-05-31 LAB — DERMPATH, SPECIMEN A

## 2021-06-20 ENCOUNTER — Other Ambulatory Visit: Payer: Self-pay | Admitting: Family Medicine

## 2021-06-20 DIAGNOSIS — M26609 Unspecified temporomandibular joint disorder, unspecified side: Secondary | ICD-10-CM

## 2021-06-21 NOTE — Telephone Encounter (Signed)
Flexeril refill request.  Last seen 12/11/2020, last filled 10/03/2020.

## 2021-07-24 ENCOUNTER — Other Ambulatory Visit: Payer: Self-pay | Admitting: Family Medicine

## 2021-07-24 DIAGNOSIS — M26609 Unspecified temporomandibular joint disorder, unspecified side: Secondary | ICD-10-CM

## 2021-08-08 DIAGNOSIS — Z1231 Encounter for screening mammogram for malignant neoplasm of breast: Secondary | ICD-10-CM | POA: Diagnosis not present

## 2021-08-24 ENCOUNTER — Ambulatory Visit (INDEPENDENT_AMBULATORY_CARE_PROVIDER_SITE_OTHER): Payer: Medicare Other | Admitting: Family Medicine

## 2021-08-24 ENCOUNTER — Encounter: Payer: Self-pay | Admitting: Family Medicine

## 2021-08-24 VITALS — BP 140/62 | HR 81 | Temp 97.2°F | Ht 60.0 in | Wt 138.0 lb

## 2021-08-24 DIAGNOSIS — I10 Essential (primary) hypertension: Secondary | ICD-10-CM | POA: Diagnosis not present

## 2021-08-24 DIAGNOSIS — G629 Polyneuropathy, unspecified: Secondary | ICD-10-CM | POA: Diagnosis not present

## 2021-08-24 DIAGNOSIS — R0989 Other specified symptoms and signs involving the circulatory and respiratory systems: Secondary | ICD-10-CM | POA: Diagnosis not present

## 2021-08-24 DIAGNOSIS — E7800 Pure hypercholesterolemia, unspecified: Secondary | ICD-10-CM

## 2021-08-24 DIAGNOSIS — I779 Disorder of arteries and arterioles, unspecified: Secondary | ICD-10-CM

## 2021-08-24 NOTE — Progress Notes (Signed)
? ?Subjective:  ? ? Patient ID: Cheryl Skinner, female    DOB: August 22, 1945, 76 y.o.   MRN: [id number] ? ?HPI ?Bruit at last visit prompted carotid dopplers: ?RIGHT CAROTID ARTERY: Mild heterogeneous plaque of the carotid ?bifurcation. ?  ?RIGHT VERTEBRAL ARTERY:  Antegrade flow. ?  ?LEFT CAROTID ARTERY: Mild to moderate redness plaque noted at the ?carotid bifurcation. ?  ?LEFT VERTEBRAL ARTERY:  Antegrade flow. ? ?Patient reports numbness in both feet primarily the toes.  She has decreased sensation to 10 g monofilament in her left big toe, and her right big toe.  She also has diminished sensation to vibration in the third fourth and fifth toes on the right foot and the big toe on the left foot.  She has normal reflexes and normal muscle strength in both legs.  She denies any burning or stinging.  She does have a history of restless leg syndrome.  There is no claudication.  However I am not appreciating good pulses in her feet.  She has 2/4 posterior tibialis pulses.  I am unable to appreciate a strong dorsalis pedis pulse bilaterally.  Otherwise she has been doing well.  She is denying any chest pain or shortness of breath or dyspnea on exertion.  Blood pressure today is acceptable at 140/62 ?Past Medical History:  ?Diagnosis Date  ? Hyperlipidemia   ? Hypertension   ? Osteopenia   ? T -2.4 in hip 2021  ? TMJ (temporomandibular joint syndrome)   ? ?Past Surgical History:  ?Procedure Laterality Date  ? ABDOMINAL HYSTERECTOMY    ? ?Current Outpatient Medications on File Prior to Visit  ?Medication Sig Dispense Refill  ? albuterol (PROVENTIL HFA;VENTOLIN HFA) 108 (90 Base) MCG/ACT inhaler Inhale 2 puffs into the lungs every 4 (four) hours as needed for wheezing or shortness of breath. (Patient not taking: Reported on 12/11/2020) 1 Inhaler 0  ? atorvastatin (LIPITOR) 40 MG tablet Take 1 tablet (40 mg total) by mouth daily. 90 tablet 2  ? cyclobenzaprine (FLEXERIL) 10 MG tablet Take 1 tablet by mouth three times daily as  needed 30 tablet 0  ? diclofenac (VOLTAREN) 75 MG EC tablet TAKE 1 TABLET BY MOUTH TWICE DAILY AS NEEDED 180 tablet 2  ? fluticasone (FLONASE) 50 MCG/ACT nasal spray Place 2 sprays into both nostrils daily. 16 g 0  ? lisinopril-hydrochlorothiazide (ZESTORETIC) 20-12.5 MG tablet Take 2 tablets by mouth daily. 180 tablet 1  ? meclizine (ANTIVERT) 25 MG tablet Take 1 tablet (25 mg total) by mouth 3 (three) times daily as needed for dizziness. 30 tablet 1  ? ?No current facility-administered medications on file prior to visit.  ? ?Allergies  ?Allergen Reactions  ? Ampicillin   ?  REACTION: makes sick  ? Codeine   ?  REACTION: loses memory  ? Hydrocodone   ?  REACTION: makes sick  ? Minocycline Hcl   ?  REACTION: makes sick  ? Nitrofurantoin   ?  REACTION: makes sick  ? Oxycodone Hcl   ?  REACTION: loses memory  ? Propoxyphene Hcl   ?  REACTION: makes sick  ? Propoxyphene N-Acetaminophen   ?  REACTION: makes sick  ? ?Social History  ? ?Socioeconomic History  ? Marital status: Married  ?  Spouse name: Not on file  ? Number of children: Not on file  ? Years of education: Not on file  ? Highest education level: Not on file  ?Occupational History  ? Not on file  ?Tobacco Use  ?  Smoking status: Former  ?  Types: Cigarettes  ? Smokeless tobacco: Never  ?Vaping Use  ? Vaping Use: Never used  ?Substance and Sexual Activity  ? Alcohol use: Yes  ?  Comment: Rare  ? Drug use: No  ? Sexual activity: Not on file  ?Other Topics Concern  ? Not on file  ?Social History Narrative  ? Not on file  ? ?Social Determinants of Health  ? ?Financial Resource Strain: Not on file  ?Food Insecurity: Not on file  ?Transportation Needs: Not on file  ?Physical Activity: Not on file  ?Stress: Not on file  ?Social Connections: Not on file  ?Intimate Partner Violence: Not on file  ? ? ? ?Review of Systems  ?All other systems reviewed and are negative. ? ?   ?Objective:  ? Physical Exam ?Constitutional:   ?   General: She is not in acute distress. ?    Appearance: Normal appearance. She is normal weight. She is not ill-appearing or toxic-appearing.  ?Neck:  ?   Vascular: Carotid bruit present.  ?Cardiovascular:  ?   Rate and Rhythm: Normal rate. Rhythm irregular.  ?   Pulses: Normal pulses.  ?   Heart sounds: Normal heart sounds. No murmur heard. ?  No gallop.  ?Pulmonary:  ?   Effort: Pulmonary effort is normal. No respiratory distress.  ?   Breath sounds: Normal breath sounds. No stridor. No wheezing, rhonchi or rales.  ?Abdominal:  ?   General: Abdomen is flat. Bowel sounds are normal. There is no distension.  ?   Palpations: Abdomen is soft.  ?   Tenderness: There is no abdominal tenderness. There is no guarding.  ?Musculoskeletal:  ?   Right lower leg: No edema.  ?   Left lower leg: No edema.  ?Neurological:  ?   Mental Status: She is alert.  ? ? ? ? ? ?   ?Assessment & Plan:  ?Pure hypercholesterolemia - Plan: CBC with Differential/Platelet, Lipid panel, COMPLETE METABOLIC PANEL WITH GFR ? ?Essential hypertension - Plan: CBC with Differential/Platelet, Lipid panel, COMPLETE METABOLIC PANEL WITH GFR ? ?Bilateral carotid artery disease, unspecified type ([place]) - Plan: CBC with Differential/Platelet, Lipid panel, COMPLETE METABOLIC PANEL WITH GFR ? ?Decreased pedal pulses - Plan: VAS [place] ABI WITH/WO TBI ? ?Neuropathy - Plan: Vitamin B12, TSH ?Due to diminished pulses in both feet, I will obtain vascular ultrasound to evaluate for ABIs.  However I suspect that the patient is developing peripheral neuropathy.  Check TSH and B12 along with a CBC CMP and a lipid panel.  Given her known carotid artery disease I like to keep her LDL cholesterol is close to 70 as possible.  Screen for diabetes.  If lab work is normal, that patient does not want to start medication for the neuropathy at the present time.  She would like to get the ultrasound to rule out any peripheral vascular disease. ?

## 2021-08-25 LAB — COMPLETE METABOLIC PANEL WITH GFR
AG Ratio: 2 (calc) (ref 1.0–2.5)
ALT: 27 U/L (ref 6–29)
AST: 21 U/L (ref 10–35)
Albumin: 4.2 g/dL (ref 3.6–5.1)
Alkaline phosphatase (APISO): 60 U/L (ref 37–153)
BUN: 23 mg/dL (ref 7–25)
CO2: 26 mmol/L (ref 20–32)
Calcium: 9.3 mg/dL (ref 8.6–10.4)
Chloride: 105 mmol/L (ref 98–110)
Creat: 0.91 mg/dL (ref 0.60–1.00)
Globulin: 2.1 g/dL (calc) (ref 1.9–3.7)
Glucose, Bld: 95 mg/dL (ref 65–99)
Potassium: 4.2 mmol/L (ref 3.5–5.3)
Sodium: 140 mmol/L (ref 135–146)
Total Bilirubin: 0.4 mg/dL (ref 0.2–1.2)
Total Protein: 6.3 g/dL (ref 6.1–8.1)
eGFR: 65 mL/min/{1.73_m2} (ref >=60)

## 2021-08-25 LAB — CBC WITH DIFFERENTIAL/PLATELET
Absolute Monocytes: 636 cells/uL (ref 200–950)
Basophils Absolute: 78 cells/uL (ref 0–200)
Basophils Relative: 1.3 %
Eosinophils Absolute: 252 cells/uL (ref 15–500)
Eosinophils Relative: 4.2 %
HCT: 39.9 % (ref 35.0–45.0)
Hemoglobin: 13.2 g/dL (ref 11.7–15.5)
Lymphs Abs: 3186 cells/uL (ref 850–3900)
MCH: 29.4 pg (ref 27.0–33.0)
MCHC: 33.1 g/dL (ref 32.0–36.0)
MCV: 88.9 fL (ref 80.0–100.0)
MPV: 11.6 fL (ref 7.5–12.5)
Monocytes Relative: 10.6 %
Neutro Abs: 1848 cells/uL (ref 1500–7800)
Neutrophils Relative %: 30.8 %
Platelets: 204 10*3/uL (ref 140–400)
RBC: 4.49 10*6/uL (ref 3.80–5.10)
RDW: 12.1 % (ref 11.0–15.0)
Total Lymphocyte: 53.1 %
WBC: 6 10*3/uL (ref 3.8–10.8)

## 2021-08-25 LAB — LIPID PANEL
Cholesterol: 152 mg/dL (ref <200)
HDL: 48 mg/dL — ABNORMAL LOW (ref >=50)
LDL Cholesterol (Calc): 81 mg/dL (calc)
Non-HDL Cholesterol (Calc): 104 mg/dL (calc) (ref <130)
Total CHOL/HDL Ratio: 3.2 (calc) (ref <5.0)
Triglycerides: 129 mg/dL (ref <150)

## 2021-08-25 LAB — TSH: TSH: 5.24 mIU/L — ABNORMAL HIGH (ref 0.40–4.50)

## 2021-08-25 LAB — VITAMIN B12: Vitamin B-12: 434 pg/mL (ref 200–1100)

## 2021-08-30 ENCOUNTER — Ambulatory Visit (HOSPITAL_COMMUNITY)
Admission: RE | Admit: 2021-08-30 | Discharge: 2021-08-30 | Disposition: A | Discharge status: Home / Self Care | Payer: Medicare Other | Source: Ambulatory Visit | Attending: Family Medicine | Admitting: Family Medicine

## 2021-08-30 DIAGNOSIS — R0989 Other specified symptoms and signs involving the circulatory and respiratory systems: Secondary | ICD-10-CM | POA: Diagnosis not present

## 2021-09-04 ENCOUNTER — Other Ambulatory Visit: Payer: Self-pay

## 2021-09-04 DIAGNOSIS — M26609 Unspecified temporomandibular joint disorder, unspecified side: Secondary | ICD-10-CM

## 2021-09-04 MED ORDER — LISINOPRIL-HYDROCHLOROTHIAZIDE 20-12.5 MG PO TABS: 2.0000 | ORAL_TABLET | Freq: Every day | ORAL | 2 refills | Status: DC

## 2021-09-04 MED ORDER — ATORVASTATIN CALCIUM 40 MG PO TABS: 40.0000 mg | ORAL_TABLET | Freq: Every day | ORAL | 2 refills | Status: DC

## 2021-09-04 MED ORDER — DICLOFENAC SODIUM 75 MG PO TBEC: DELAYED_RELEASE_TABLET | ORAL | 2 refills | Status: DC

## 2021-09-04 MED ORDER — CYCLOBENZAPRINE HCL 10 MG PO TABS: 10.0000 mg | ORAL_TABLET | Freq: Three times a day (TID) | ORAL | 2 refills | Status: DC | PRN

## 2021-09-04 MED ORDER — MECLIZINE HCL 25 MG PO TABS: 25.0000 mg | ORAL_TABLET | Freq: Three times a day (TID) | ORAL | 1 refills | Status: DC | PRN

## 2021-10-29 IMAGING — DX DG HIP (WITH OR WITHOUT PELVIS) 2-3V*L*
3 series · 3 of 3 positions shown · non-contrast
Comparison: None.

CLINICAL DATA: Fall, hip pain

EXAM:
DG HIP (WITH OR WITHOUT PELVIS) 2-3V LEFT

[pelvis ap]
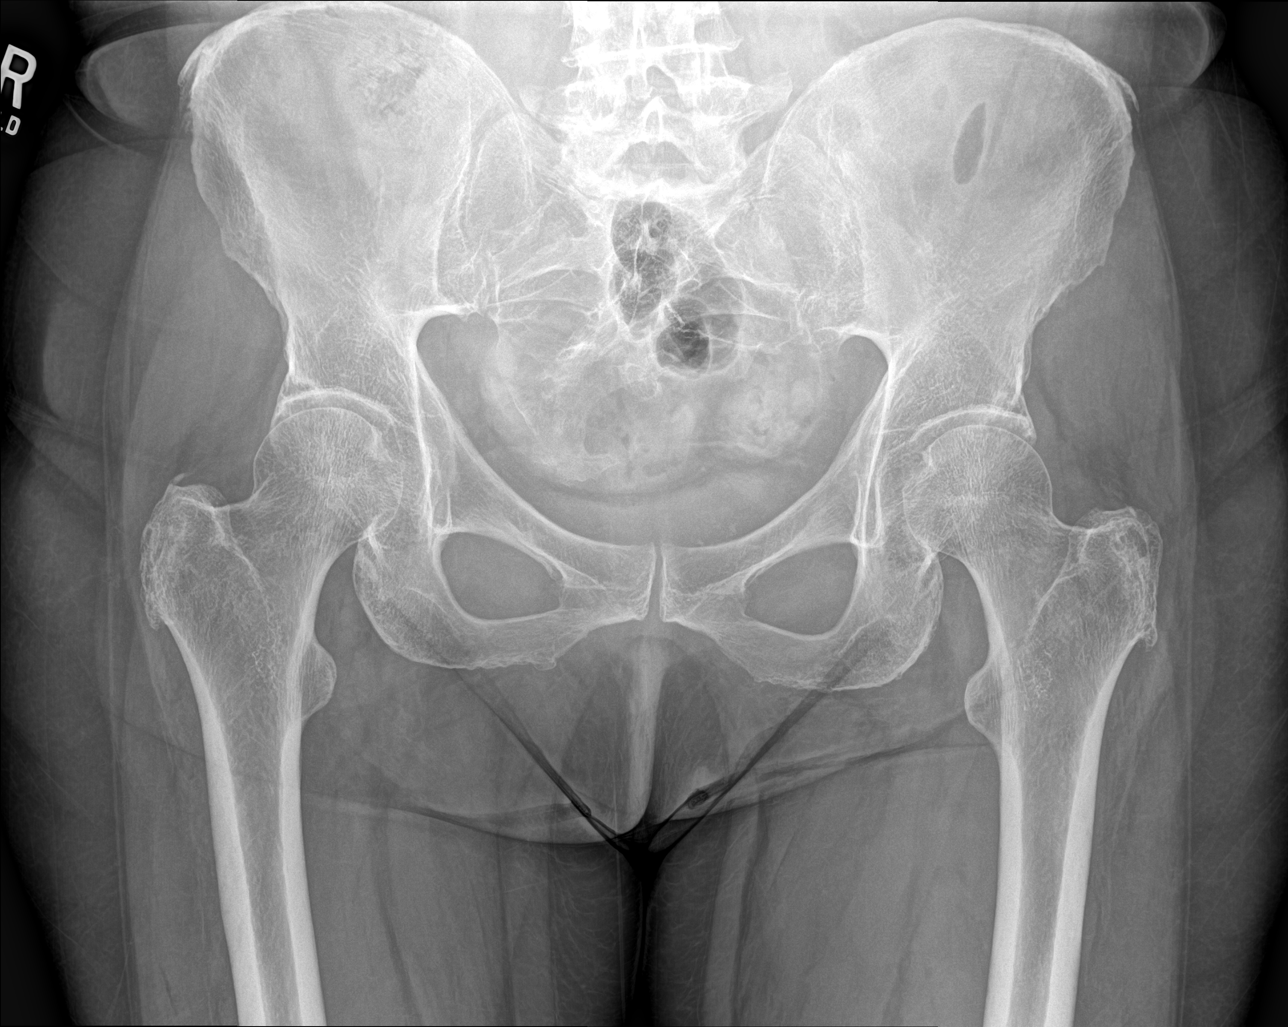

[hip ap]
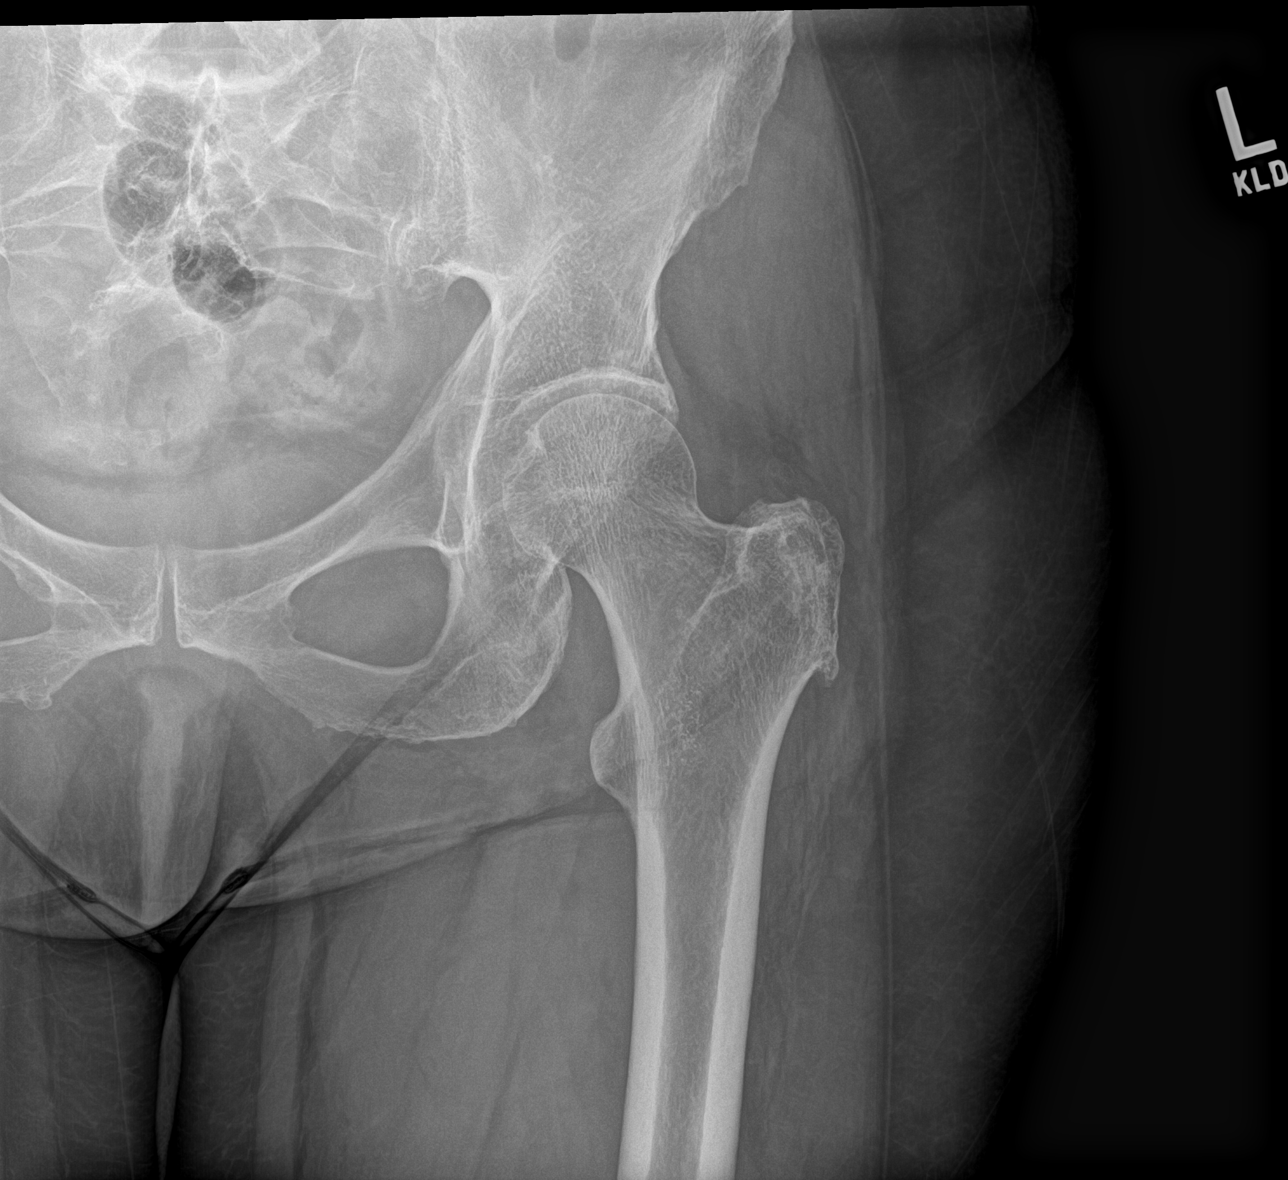

[hip lat]
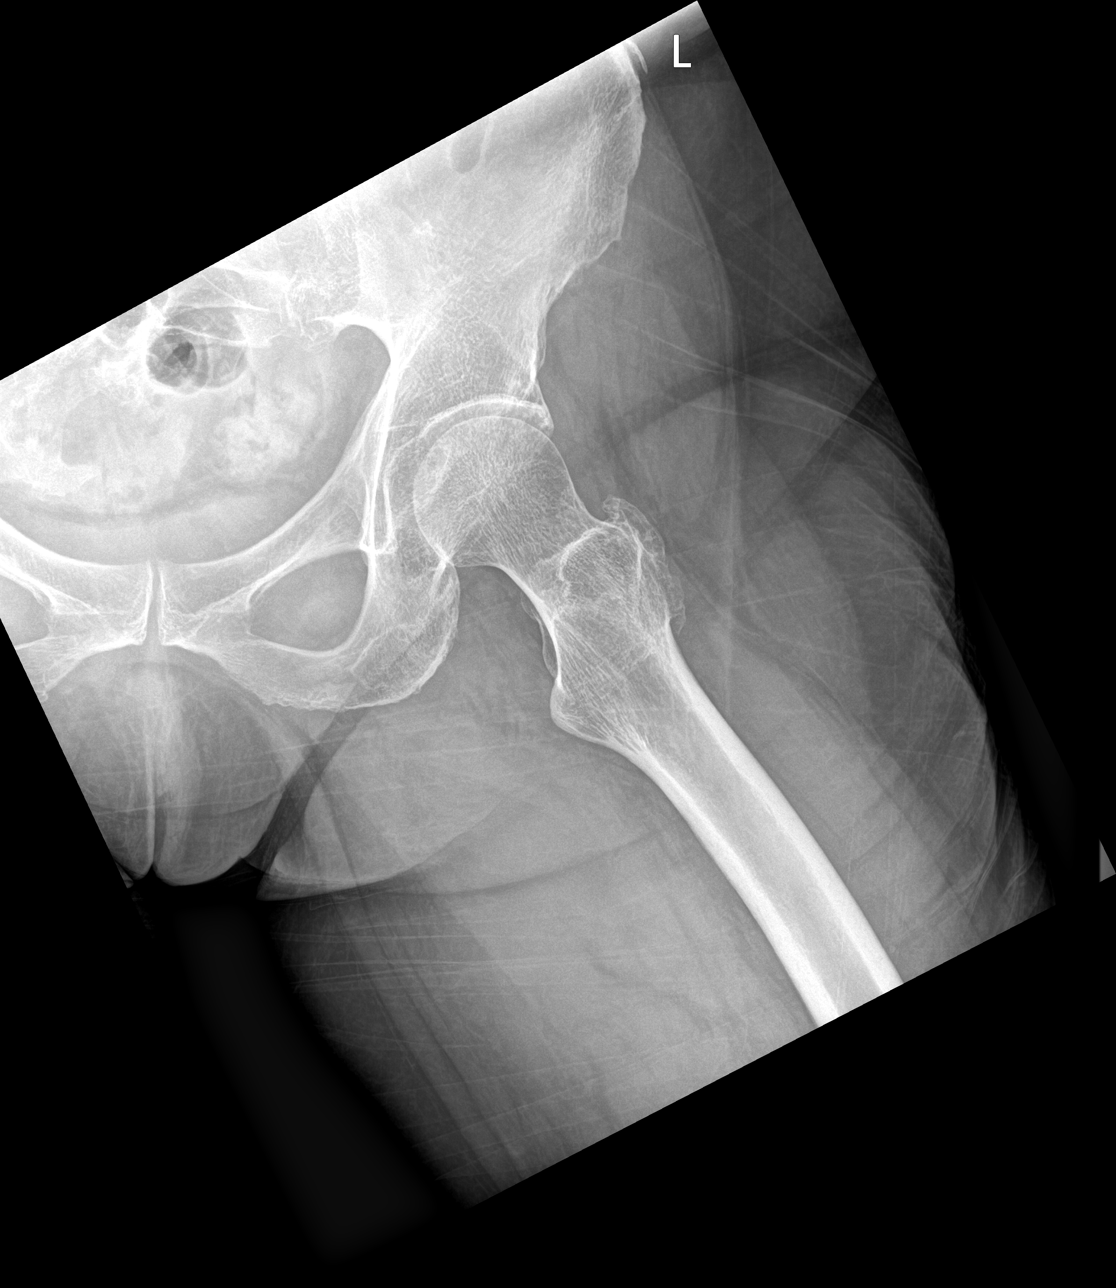

[3 of 3 positions shown; findings below may reference images not displayed]

FINDINGS: Hip joints and SI joints are symmetric and unremarkable. No acute
bony abnormality. Specifically, no fracture, subluxation, or
dislocation.
IMPRESSION: No acute bony abnormality.

## 2021-12-10 ENCOUNTER — Other Ambulatory Visit: Payer: Self-pay | Admitting: Family Medicine

## 2021-12-10 DIAGNOSIS — M26609 Unspecified temporomandibular joint disorder, unspecified side: Secondary | ICD-10-CM

## 2021-12-11 NOTE — Telephone Encounter (Signed)
Requested medications are due for refill today.  yes  Requested medications are on the active medications list.  yes  Last refill. 09/04/2021 #30 2 refills  Future visit scheduled.   no  Notes to clinic.  Refill not delegated.    Requested Prescriptions  Pending Prescriptions Disp Refills   cyclobenzaprine (FLEXERIL) 10 MG tablet [Pharmacy Med Name: Cyclobenzaprine HCl 10 MG Oral Tablet] 30 tablet 0    Sig: Take 1 tablet by mouth three times daily as needed     Not Delegated - Analgesics:  Muscle Relaxants Failed - 12/10/2021  1:25 PM      Failed - This refill cannot be delegated      Passed - Valid encounter within last 6 months    Recent Outpatient Visits           3 months ago Pure hypercholesterolemia   [place] Pickard, Cammie Mcgee, MD   1 year ago Pure hypercholesterolemia   [place] [facility], Cammie Mcgee, MD   1 year ago Upper respiratory tract infection, unspecified type   [place] Eulogio Bear, NP   2 years ago Viral URI   [place] [facility] Schaumann, Cammie Mcgee, MD   2 years ago Pure hypercholesterolemia   [place] Pick[facility], Cammie Mcgee, MD

## 2021-12-23 IMAGING — US US CAROTID DUPLEX BILAT
1 series · 14 of 24 positions shown · non-contrast
Comparison: None.

CLINICAL DATA: Right carotid bruit

Hypertension
Hyperlipidemia
EXAM:
BILATERAL CAROTID DUPLEX ULTRASOUND
TECHNIQUE: Gray scale imaging, color Doppler and duplex ultrasound were
performed of bilateral carotid and vertebral arteries in the neck.

[Series 1: us carotid duplex bilat · 0.06mm/px · 14 of 71 slices shown]
[im 1/71]
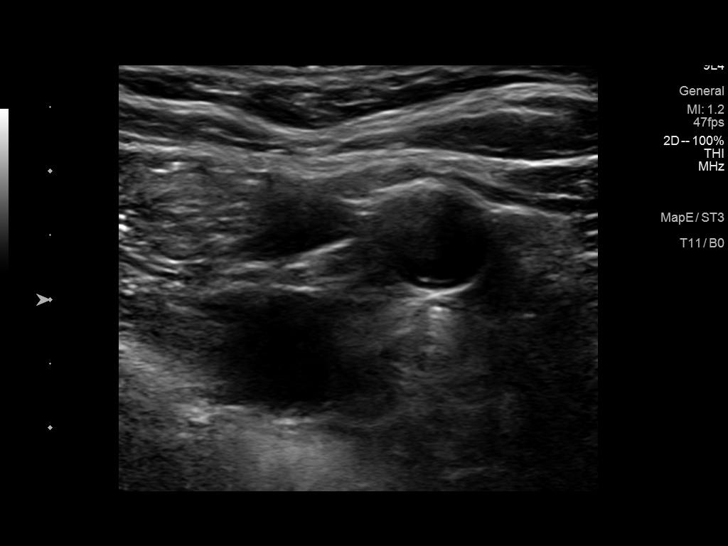
[im 7/71]
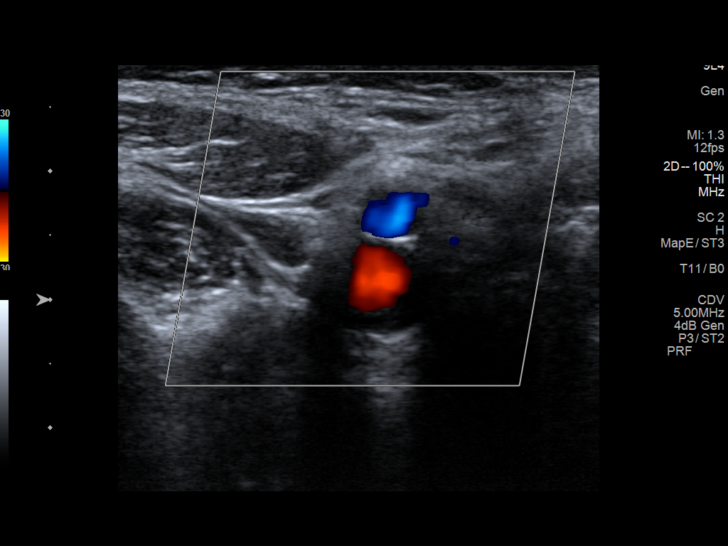
[im 13/71]
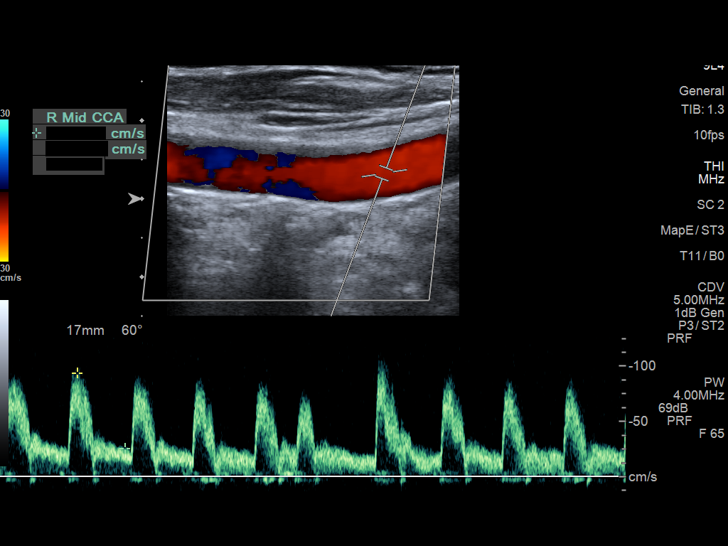
[im 19/71]
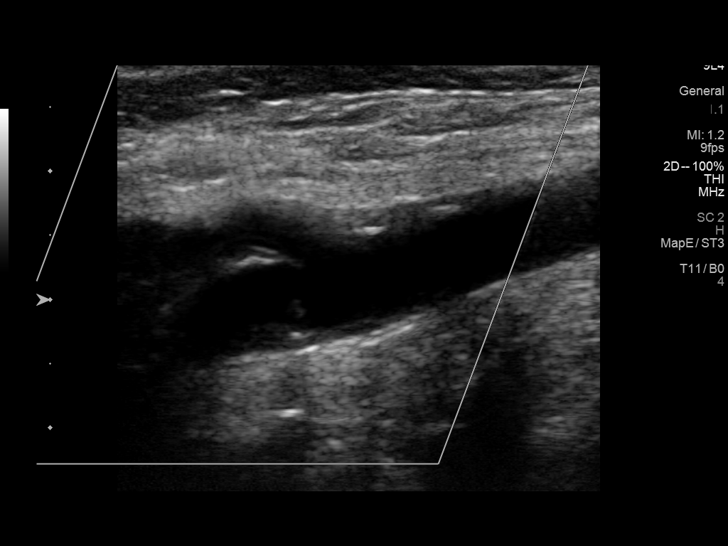
[im 22/71]
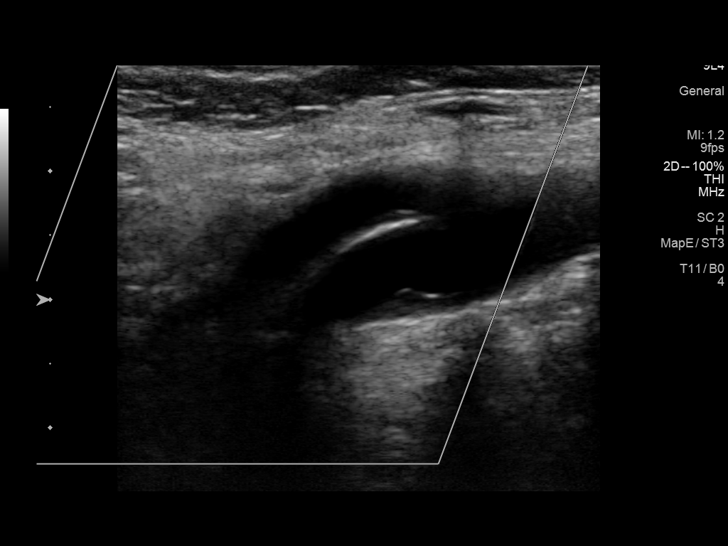
[im 28/71]
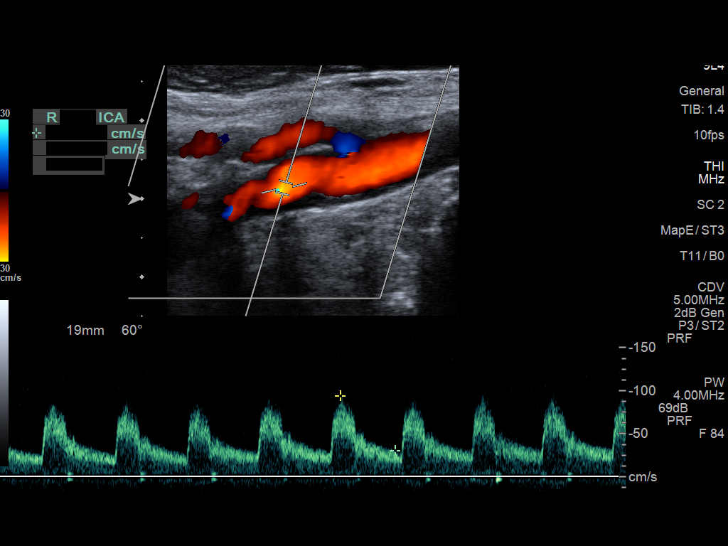
[im 34/71]
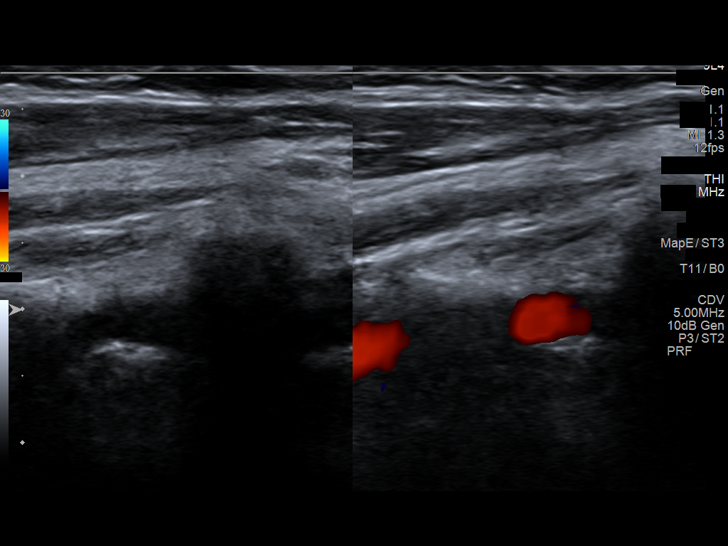
[im 37/71]
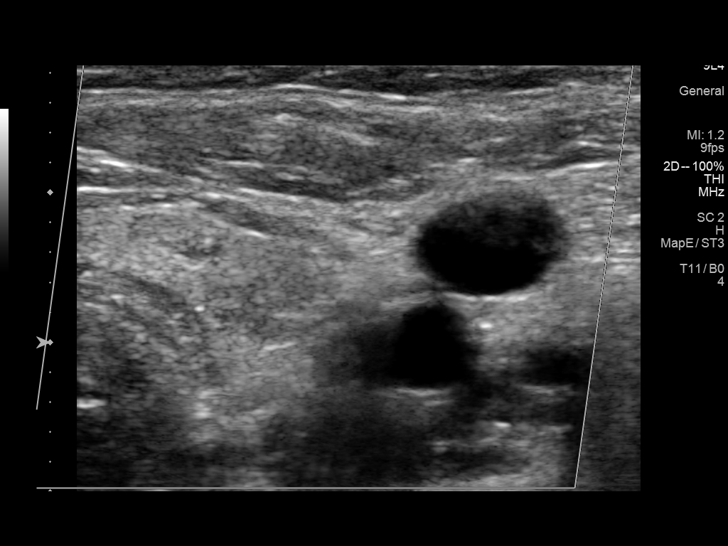
[im 43/71]
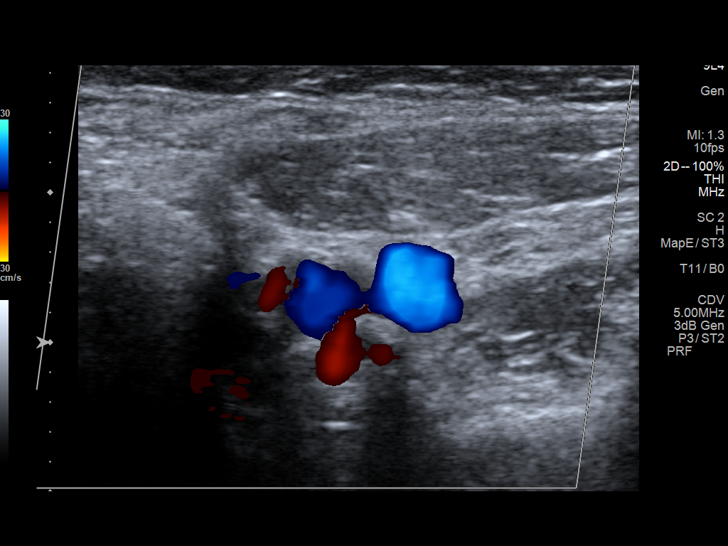
[im 49/71]
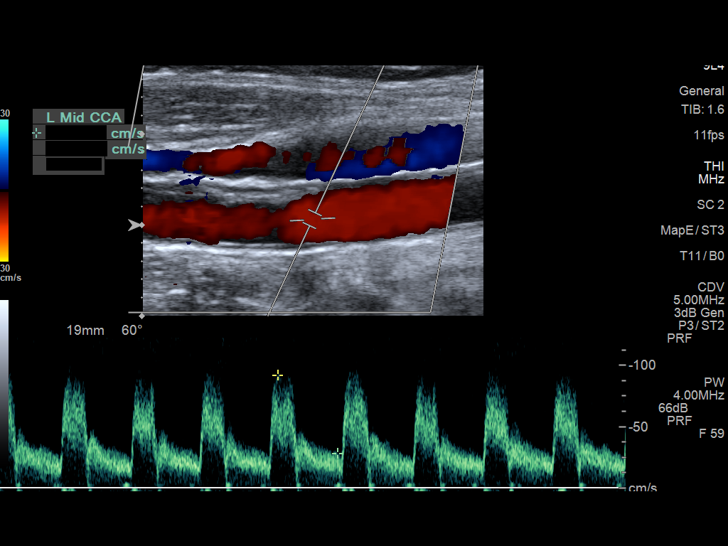
[im 55/71]
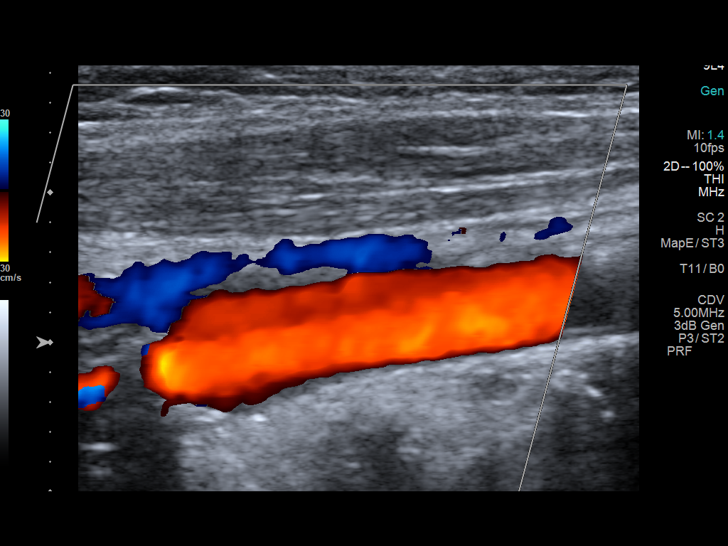
[im 58/71]
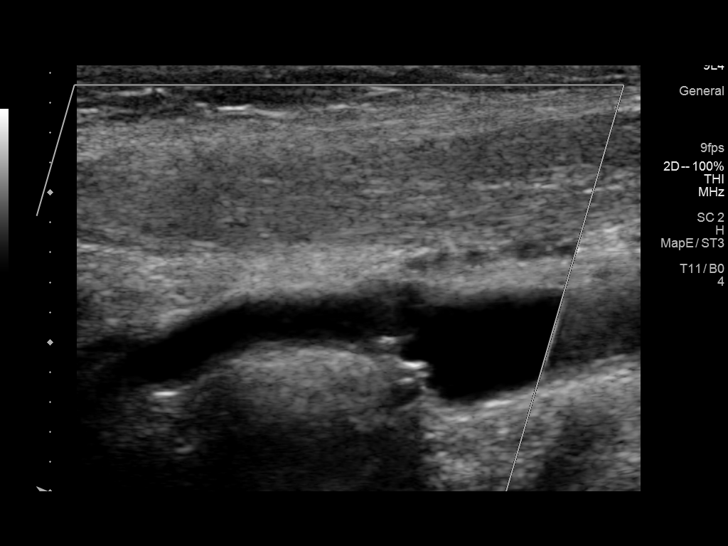
[im 64/71]
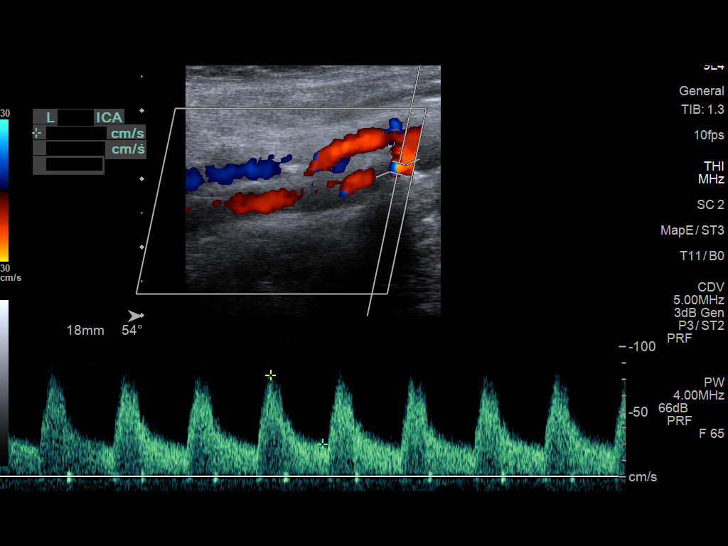
[im 71/71]
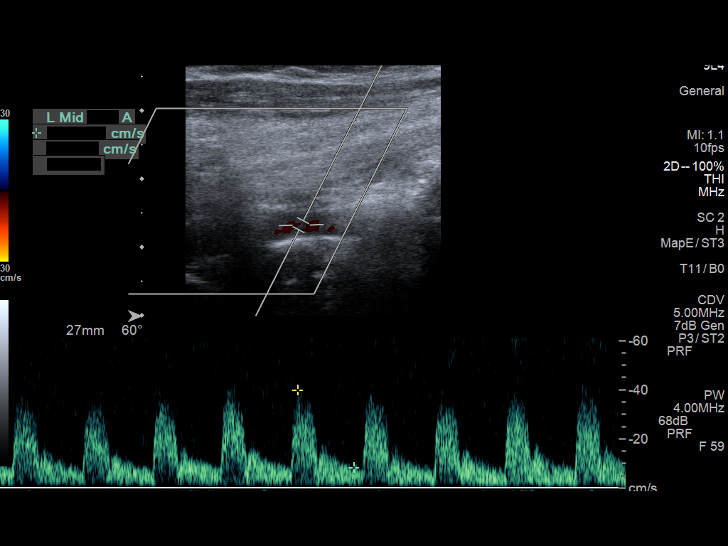

[14 of 24 positions shown; findings below may reference images not displayed]

FINDINGS: Criteria: Quantification of carotid stenosis is based on velocity
parameters that correlate the residual internal carotid diameter
with NASCET-based stenosis levels, using the diameter of the distal
internal carotid lumen as the denominator for stenosis measurement.

The following velocity measurements were obtained:

RIGHT

ICA: 127/40 cm/sec

CCA: 100/27 cm/sec

SYSTOLIC ICA/CCA RATIO:

ECA: 129 cm/sec

LEFT

ICA: 105/33 cm/sec

CCA: 92/28 cm/sec

SYSTOLIC ICA/CCA RATIO:

ECA: 89/13 cm/sec

RIGHT CAROTID ARTERY: Mild heterogeneous plaque of the carotid
bifurcation.

RIGHT VERTEBRAL ARTERY:  Antegrade flow.

LEFT CAROTID ARTERY: Mild to moderate redness plaque noted at the
carotid bifurcation.

LEFT VERTEBRAL ARTERY:  Antegrade flow.
IMPRESSION: Less than 50% stenosis of the internal carotid arteries.

## 2022-01-16 ENCOUNTER — Other Ambulatory Visit: Payer: Self-pay | Admitting: Family Medicine

## 2022-01-16 DIAGNOSIS — M26609 Unspecified temporomandibular joint disorder, unspecified side: Secondary | ICD-10-CM

## 2022-01-17 NOTE — Telephone Encounter (Signed)
Requested medication (s) are due for refill today: yes  Requested medication (s) are on the active medication list: yes  Last refill:  12/13/21  Future visit scheduled: yes  Notes to clinic:  Unable to refill per protocol, cannot delegate.      Requested Prescriptions  Pending Prescriptions Disp Refills   cyclobenzaprine (FLEXERIL) 10 MG tablet [Pharmacy Med Name: Cyclobenzaprine HCl 10 MG Oral Tablet] 30 tablet 0    Sig: Take 1 tablet by mouth three times daily as needed     Not Delegated - Analgesics:  Muscle Relaxants Failed - 01/16/2022  3:18 PM      Failed - This refill cannot be delegated      Passed - Valid encounter within last 6 months    Recent Outpatient Visits           4 months ago Pure hypercholesterolemia   [place] [facility], Cammie Mcgee, MD   1 year ago Pure hypercholesterolemia   [place] [facility], Cammie Mcgee, MD   1 year ago Upper respiratory tract infection, unspecified type   [place] Eulogio Bear, NP   2 years ago Viral URI   [place] [facility] Schaumann, Cammie Mcgee, MD   2 years ago Pure hypercholesterolemia   [place] Pick[facility], Cammie Mcgee, MD

## 2022-02-12 ENCOUNTER — Telehealth: Payer: Self-pay

## 2022-02-12 NOTE — Telephone Encounter (Signed)
Pt c/o cough since starting Lisinopril. Pt asks if the Lisinopril could be causing. Pt states it is non productive, just annoying.  Pt states she is having issues with numbness and pain in her toes. Pt asks if she can have a podiatry referral? Thank you.

## 2022-02-14 ENCOUNTER — Other Ambulatory Visit: Payer: Self-pay

## 2022-02-14 DIAGNOSIS — I10 Essential (primary) hypertension: Secondary | ICD-10-CM

## 2022-02-14 MED ORDER — LOSARTAN POTASSIUM-HCTZ 50-12.5 MG PO TABS: 1.0000 | ORAL_TABLET | Freq: Every day | ORAL | 3 refills | Status: DC

## 2022-02-15 ENCOUNTER — Other Ambulatory Visit: Payer: Self-pay

## 2022-02-15 DIAGNOSIS — R0989 Other specified symptoms and signs involving the circulatory and respiratory systems: Secondary | ICD-10-CM

## 2022-02-15 DIAGNOSIS — G629 Polyneuropathy, unspecified: Secondary | ICD-10-CM

## 2022-02-27 ENCOUNTER — Ambulatory Visit: Payer: Medicare Other | Admitting: Podiatry

## 2022-02-27 ENCOUNTER — Encounter: Payer: Self-pay | Admitting: Podiatry

## 2022-02-27 DIAGNOSIS — G629 Polyneuropathy, unspecified: Secondary | ICD-10-CM

## 2022-02-28 NOTE — Progress Notes (Signed)
Subjective:   Patient ID: Cheryl Skinner, female   DOB: 76 y.o.   MRN: [id number]   HPI Patient presents stating she has numbness that she wanted checked of the low-grade nature and occasional cramps and she is concerned about circulatory status.  Patient states that she does not have cramps when walking is able to walk distances currently without pain does not smoke likes to be active   Review of Systems  All other systems reviewed and are negative.       Objective:  Physical Exam Vitals and nursing note reviewed.  Constitutional:      Appearance: She is well-developed.  Pulmonary:     Effort: Pulmonary effort is normal.  Musculoskeletal:        General: Normal range of motion.  Skin:    General: Skin is warm.  Neurological:     Mental Status: She is alert.     Vascular status intact slightly weakened pulses but within normal limits with patient found to have good digital perfusion well-oriented toes are warm and no indication of loss of sharp dull vibratory currently.     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

## 2022-06-05 DIAGNOSIS — L821 Other seborrheic keratosis: Secondary | ICD-10-CM | POA: Diagnosis not present

## 2022-06-05 DIAGNOSIS — D225 Melanocytic nevi of trunk: Secondary | ICD-10-CM | POA: Diagnosis not present

## 2022-06-05 DIAGNOSIS — L814 Other melanin hyperpigmentation: Secondary | ICD-10-CM | POA: Diagnosis not present

## 2022-06-05 DIAGNOSIS — L570 Actinic keratosis: Secondary | ICD-10-CM | POA: Diagnosis not present

## 2022-06-05 DIAGNOSIS — Z85828 Personal history of other malignant neoplasm of skin: Secondary | ICD-10-CM | POA: Diagnosis not present

## 2022-06-06 ENCOUNTER — Other Ambulatory Visit: Payer: Self-pay | Admitting: Family Medicine

## 2022-08-14 LAB — HM MAMMOGRAPHY

## 2022-09-04 ENCOUNTER — Other Ambulatory Visit: Payer: Self-pay | Admitting: Family Medicine

## 2022-09-04 DIAGNOSIS — M26609 Unspecified temporomandibular joint disorder, unspecified side: Secondary | ICD-10-CM

## 2022-09-05 NOTE — Telephone Encounter (Signed)
Requested medications are due for refill today.  yes  Requested medications are on the active medications list.  yes  Last refill. varied  Future visit scheduled.   no  Notes to clinic.  Labs are expired. Pt last seen 07/2021.    Requested Prescriptions  Pending Prescriptions Disp Refills   atorvastatin (LIPITOR) 40 MG tablet [Pharmacy Med Name: Atorvastatin Calcium 40 MG Oral Tablet] 90 tablet 0    Sig: Take 1 tablet by mouth once daily     Cardiovascular:  Antilipid - Statins Failed - 09/04/2022 11:01 AM      Failed - Valid encounter within last 12 months    Recent Outpatient Visits           1 year ago Pure hypercholesterolemia   [facility] Pickard, Priscille Heidelberg, MD   1 year ago Pure hypercholesterolemia   [facility] Pickard, Priscille Heidelberg, MD   1 year ago Upper respiratory tract infection, unspecified type   [facility] Valentino Nose, NP   3 years ago Viral URI   [facility] Pickard, Priscille Heidelberg, MD   3 years ago Pure hypercholesterolemia   [facility] Pickard, Priscille Heidelberg, MD              Failed - Lipid Panel in normal range within the last 12 months    Cholesterol  Date Value Ref Range Status  08/24/2021 152 <200 mg/dL Final   LDL Cholesterol (Calc)  Date Value Ref Range Status  08/24/2021 81 mg/dL (calc) Final    Comment:    Reference range: <100 . Desirable range <100 mg/dL for primary prevention;   <70 mg/dL for patients with CHD or diabetic patients  with > or = 2 CHD risk factors. Marland Kitchen LDL-C is now calculated using the Martin-Hopkins  calculation, which is a validated novel method providing  better accuracy than the Friedewald equation in the  estimation of LDL-C.  Horald Pollen et al. Lenox Ahr. 9407;680(88): 2061-2068  (http://education.[facility].com/faq/FAQ164)    HDL  Date Value Ref Range Status  08/24/2021 48 (L) > OR = 50 mg/dL Final   Triglycerides  Date Value  Ref Range Status  08/24/2021 129 <150 mg/dL Final         Passed - Patient is not pregnant       diclofenac (VOLTAREN) 75 MG EC tablet [Pharmacy Med Name: Diclofenac Sodium 75 MG Oral Tablet Delayed Release] 180 tablet 0    Sig: TAKE 1 TABLET BY MOUTH TWICE DAILY AS NEEDED     Analgesics:  NSAIDS Failed - 09/04/2022 11:01 AM      Failed - Manual Review: Labs are only required if the patient has taken medication for more than 8 weeks.      Failed - Cr in normal range and within 360 days    Creat  Date Value Ref Range Status  08/24/2021 0.91 0.60 - 1.00 mg/dL Final         Failed - HGB in normal range and within 360 days    Hemoglobin  Date Value Ref Range Status  08/24/2021 13.2 11.7 - 15.5 g/dL Final         Failed - PLT in normal range and within 360 days    Platelets  Date Value Ref Range Status  08/24/2021 204 140 - 400 Thousand/uL Final         Failed - HCT in normal range and within 360 days  HCT  Date Value Ref Range Status  08/24/2021 39.9 35.0 - 45.0 % Final         Failed - eGFR is 30 or above and within 360 days    GFR, Est African American  Date Value Ref Range Status  05/11/2019 63 > OR = 60 mL/min/1.53m2 Final   GFR, Est Non African American  Date Value Ref Range Status  05/11/2019 54 (L) > OR = 60 mL/min/1.68m2 Final   eGFR  Date Value Ref Range Status  08/24/2021 65 > OR = 60 mL/min/1.55m2 Final    Comment:    The eGFR is based on the CKD-EPI 2021 equation. To calculate  the new eGFR from a previous Creatinine or Cystatin C result, go to https://www.kidney.org/professionals/ kdoqi/gfr%5Fcalculator          Failed - Valid encounter within last 12 months    Recent Outpatient Visits           1 year ago Pure hypercholesterolemia   [facility] Pickard, Priscille Heidelberg, MD   1 year ago Pure hypercholesterolemia   [facility] Tanya Nones, Priscille Heidelberg, MD   1 year ago Upper respiratory tract infection, unspecified type    [facility] Valentino Nose, NP   3 years ago Viral URI   [facility] Tanya Nones, Priscille Heidelberg, MD   3 years ago Pure hypercholesterolemia   [facility] Pickard, Priscille Heidelberg, MD              Passed - Patient is not pregnant       cyclobenzaprine (FLEXERIL) 10 MG tablet [Pharmacy Med Name: Cyclobenzaprine HCl 10 MG Oral Tablet] 30 tablet 0    Sig: Take 1 tablet by mouth three times daily as needed     Not Delegated - Analgesics:  Muscle Relaxants Failed - 09/04/2022 11:01 AM      Failed - This refill cannot be delegated      Failed - Valid encounter within last 6 months    Recent Outpatient Visits           1 year ago Pure hypercholesterolemia   [facility] Pickard, Priscille Heidelberg, MD   1 year ago Pure hypercholesterolemia   [facility] Pickard, Priscille Heidelberg, MD   1 year ago Upper respiratory tract infection, unspecified type   [facility] Valentino Nose, NP   3 years ago Viral URI   [facility] Pickard, Priscille Heidelberg, MD   3 years ago Pure hypercholesterolemia   [facility] Pickard, Priscille Heidelberg, MD

## 2022-09-09 ENCOUNTER — Encounter: Payer: Self-pay | Admitting: *Deleted

## 2022-09-18 ENCOUNTER — Other Ambulatory Visit: Payer: Self-pay | Admitting: Family Medicine

## 2022-12-04 ENCOUNTER — Other Ambulatory Visit: Payer: Self-pay | Admitting: Family Medicine

## 2022-12-04 DIAGNOSIS — M26609 Unspecified temporomandibular joint disorder, unspecified side: Secondary | ICD-10-CM

## 2022-12-04 NOTE — Telephone Encounter (Signed)
Requested medication (s) are due for refill today: Yes  Requested medication (s) are on the active medication list: Yes  Last refill:  09/05/22  Future visit scheduled: No  Notes to clinic:  Unable to refill per protocol, cannot delegate. Unable to refill per protocol, appointment needed.      Requested Prescriptions  Pending Prescriptions Disp Refills   diclofenac (VOLTAREN) 75 MG EC tablet [Pharmacy Med Name: Diclofenac Sodium 75 MG Oral Tablet Delayed Release] 180 tablet 0    Sig: Take 1 tablet by mouth twice daily as needed     Analgesics:  NSAIDS Failed - 12/04/2022 10:42 AM      Failed - Manual Review: Labs are only required if the patient has taken medication for more than 8 weeks.      Failed - Cr in normal range and within 360 days    Creat  Date Value Ref Range Status  08/24/2021 0.91 0.60 - 1.00 mg/dL Final         Failed - HGB in normal range and within 360 days    Hemoglobin  Date Value Ref Range Status  08/24/2021 13.2 11.7 - 15.5 g/dL Final         Failed - PLT in normal range and within 360 days    Platelets  Date Value Ref Range Status  08/24/2021 204 140 - 400 Thousand/uL Final         Failed - HCT in normal range and within 360 days    HCT  Date Value Ref Range Status  08/24/2021 39.9 35.0 - 45.0 % Final         Failed - eGFR is 30 or above and within 360 days    GFR, Est African American  Date Value Ref Range Status  05/11/2019 63 > OR = 60 mL/min/1.43m2 Final   GFR, Est Non African American  Date Value Ref Range Status  05/11/2019 54 (L) > OR = 60 mL/min/1.2m2 Final   eGFR  Date Value Ref Range Status  08/24/2021 65 > OR = 60 mL/min/1.56m2 Final    Comment:    The eGFR is based on the CKD-EPI 2021 equation. To calculate  the new eGFR from a previous Creatinine or Cystatin C result, go to https://www.kidney.org/professionals/ kdoqi/gfr%5Fcalculator          Failed - Valid encounter within last 12 months    Recent Outpatient Visits            1 year ago Pure hypercholesterolemia   [facility] Pickard, Priscille Heidelberg, MD   1 year ago Pure hypercholesterolemia   [facility] Tanya Nones, Priscille Heidelberg, MD   2 years ago Upper respiratory tract infection, unspecified type   [facility] Valentino Nose, NP   3 years ago Viral URI   [facility] Pickard, Priscille Heidelberg, MD   3 years ago Pure hypercholesterolemia   [facility] Pickard, Priscille Heidelberg, MD              Passed - Patient is not pregnant       cyclobenzaprine (FLEXERIL) 10 MG tablet [Pharmacy Med Name: Cyclobenzaprine HCl 10 MG Oral Tablet] 30 tablet 0    Sig: Take 1 tablet by mouth three times daily as needed     Not Delegated - Analgesics:  Muscle Relaxants Failed - 12/04/2022 10:42 AM      Failed - This refill cannot be delegated      Failed -  Valid encounter within last 6 months    Recent Outpatient Visits           1 year ago Pure hypercholesterolemia   [facility] Pickard, Priscille Heidelberg, MD   1 year ago Pure hypercholesterolemia   [facility] Tanya Nones, Priscille Heidelberg, MD   2 years ago Upper respiratory tract infection, unspecified type   [facility] Valentino Nose, NP   3 years ago Viral URI   [facility] Tanya Nones, Priscille Heidelberg, MD   3 years ago Pure hypercholesterolemia   [facility] Pickard, Priscille Heidelberg, MD               atorvastatin (LIPITOR) 40 MG tablet [Pharmacy Med Name: Atorvastatin Calcium 40 MG Oral Tablet] 90 tablet 0    Sig: Take 1 tablet by mouth once daily     Cardiovascular:  Antilipid - Statins Failed - 12/04/2022 10:42 AM      Failed - Valid encounter within last 12 months    Recent Outpatient Visits           1 year ago Pure hypercholesterolemia   [facility] Pickard, Priscille Heidelberg, MD   1 year ago Pure hypercholesterolemia   [facility] Tanya Nones,  Priscille Heidelberg, MD   2 years ago Upper respiratory tract infection, unspecified type   [facility] Medicine Valentino Nose, NP   3 years ago Viral URI   [facility] Pickard, Priscille Heidelberg, MD   3 years ago Pure hypercholesterolemia   [facility] Tanya Nones, Priscille Heidelberg, MD              Failed - Lipid Panel in normal range within the last 12 months    Cholesterol  Date Value Ref Range Status  08/24/2021 152 <200 mg/dL Final   LDL Cholesterol (Calc)  Date Value Ref Range Status  08/24/2021 81 mg/dL (calc) Final    Comment:    Reference range: <100 . Desirable range <100 mg/dL for primary prevention;   <70 mg/dL for patients with CHD or diabetic patients  with > or = 2 CHD risk factors. Marland Kitchen LDL-C is now calculated using the Martin-Hopkins  calculation, which is a validated novel method providing  better accuracy than the Friedewald equation in the  estimation of LDL-C.  Horald Pollen et al. Lenox Ahr. 1610;960(45): 2061-2068  (http://education.[facility].com/faq/FAQ164)    HDL  Date Value Ref Range Status  08/24/2021 48 (L) > OR = 50 mg/dL Final   Triglycerides  Date Value Ref Range Status  08/24/2021 129 <150 mg/dL Final         Passed - Patient is not pregnant

## 2023-01-17 ENCOUNTER — Ambulatory Visit (INDEPENDENT_AMBULATORY_CARE_PROVIDER_SITE_OTHER): Payer: Medicare Other | Admitting: Family Medicine

## 2023-01-17 VITALS — BP 126/72 | HR 95 | Temp 97.7°F | Ht 60.0 in | Wt 135.4 lb

## 2023-01-17 DIAGNOSIS — G629 Polyneuropathy, unspecified: Secondary | ICD-10-CM

## 2023-01-17 NOTE — Progress Notes (Signed)
Subjective:    Patient ID: Cheryl Skinner, female    DOB: 07-19-1945, 77 y.o.   MRN: [id number]  HPI 08/24/21 Patient reports numbness in both feet primarily the toes.  She has decreased sensation to 10 g monofilament in her left big toe, and her right big toe.  She also has diminished sensation to vibration in the third fourth and fifth toes on the right foot and the big toe on the left foot.  She has normal reflexes and normal muscle strength in both legs.  She denies any burning or stinging.  She does have a history of restless leg syndrome.  There is no claudication.  However I am not appreciating good pulses in her feet.  She has 2/4 posterior tibialis pulses.  I am unable to appreciate a strong dorsalis pedis pulse bilaterally.  Otherwise she has been doing well.  She is denying any chest pain or shortness of breath or dyspnea on exertion.  Blood pressure today is acceptable at 140/62.  At that time, my plan was: Due to diminished pulses in both feet, I will obtain vascular ultrasound to evaluate for ABIs.  However I suspect that the patient is developing peripheral neuropathy.  Check TSH and B12 along with a CBC CMP and a lipid panel.  Given her known carotid artery disease I like to keep her LDL cholesterol is close to 70 as possible.  Screen for diabetes.  If lab work is normal, that patient does not want to start medication for the neuropathy at the present time.  She would like to get the ultrasound to rule out any peripheral vascular disease.  01/17/23 Arterial dopplers were normal in 4/23.  Patient continues to endorse numbness in all 5 toes on both feet.  She denies any burning or stinging.  She is able to have gross sensation.  However she has loss of fine 2 point discrimination in her toes.  There are no wounds, lacerations, or sores on her toes.  She is here to discuss neuropathy further Past Medical History:  Diagnosis Date   Hyperlipidemia    Hypertension    Osteopenia    T -2.4 in  hip 2021   TMJ (temporomandibular joint syndrome)    Past Surgical History:  Procedure Laterality Date   ABDOMINAL HYSTERECTOMY     Current Outpatient Medications on File Prior to Visit  Medication Sig Dispense Refill   albuterol (PROVENTIL HFA;VENTOLIN HFA) 108 (90 Base) MCG/ACT inhaler Inhale 2 puffs into the lungs every 4 (four) hours as needed for wheezing or shortness of breath. (Patient not taking: Reported on 08/24/2021) 1 Inhaler 0   atorvastatin (LIPITOR) 40 MG tablet Take 1 tablet by mouth once daily 90 tablet 0   cyclobenzaprine (FLEXERIL) 10 MG tablet Take 1 tablet by mouth three times daily as needed 30 tablet 0   diclofenac (VOLTAREN) 75 MG EC tablet Take 1 tablet by mouth twice daily as needed 180 tablet 0   fluticasone (FLONASE) 50 MCG/ACT nasal spray Place 2 sprays into both nostrils daily. (Patient not taking: Reported on 08/24/2021) 16 g 0   losartan-hydrochlorothiazide (HYZAAR) 50-12.5 MG tablet Take 1 tablet by mouth daily. 90 tablet 3   meclizine (ANTIVERT) 25 MG tablet TAKE 1 TABLET BY MOUTH THREE TIMES DAILY AS NEEDED FOR DIZZINESS 30 tablet 0   No current facility-administered medications on file prior to visit.   Allergies  Allergen Reactions   Ampicillin     REACTION: makes sick   Codeine  REACTION: loses memory   Hydrocodone     REACTION: makes sick   Minocycline Hcl     REACTION: makes sick   Nitrofurantoin     REACTION: makes sick   Oxycodone Hcl     REACTION: loses memory   Propoxyphene Hcl     REACTION: makes sick   Propoxyphene N-Acetaminophen     REACTION: makes sick   Social History   Socioeconomic History   Marital status: Married    Spouse name: Not on file   Number of children: Not on file   Years of education: Not on file   Highest education level: Not on file  Occupational History   Not on file  Tobacco Use   Smoking status: Former    Types: Cigarettes   Smokeless tobacco: Never  Vaping Use   Vaping status: Never Used   Substance and Sexual Activity   Alcohol use: Yes    Comment: Rare   Drug use: No   Sexual activity: Not on file  Other Topics Concern   Not on file  Social History Narrative   Not on file   Social Determinants of Health   Financial Resource Strain: Not on file  Food Insecurity: Not on file  Transportation Needs: Not on file  Physical Activity: Not on file  Stress: Not on file  Social Connections: Not on file  Intimate Partner Violence: Not on file     Review of Systems  All other systems reviewed and are negative.      Objective:   Physical Exam Constitutional:      General: She is not in acute distress.    Appearance: Normal appearance. She is normal weight. She is not ill-appearing or toxic-appearing.  Cardiovascular:     Rate and Rhythm: Normal rate. Rhythm irregular.     Pulses: Normal pulses.          Dorsalis pedis pulses are 2+ on the right side and 2+ on the left side.       Posterior tibial pulses are 2+ on the right side and 2+ on the left side.     Heart sounds: Normal heart sounds. No murmur heard.    No gallop.  Pulmonary:     Effort: Pulmonary effort is normal. No respiratory distress.     Breath sounds: Normal breath sounds. No stridor. No wheezing, rhonchi or rales.  Abdominal:     General: Abdomen is flat. Bowel sounds are normal. There is no distension.     Palpations: Abdomen is soft.     Tenderness: There is no abdominal tenderness. There is no guarding.  Musculoskeletal:     Right lower leg: No edema.     Left lower leg: No edema.     Right foot: Bunion present.     Left foot: Bunion present.  Feet:     Right foot:     Skin integrity: Skin integrity normal.     Left foot:     Skin integrity: Skin integrity normal.  Neurological:     Mental Status: She is alert.           Assessment & Plan:  Neuropathy Patient has idiopathic peripheral neuropathy.  We discussed starting Neurontin for nerve pain.  At the present time she is not  having much pain just numbness.  Therefore she will trial a vitamin B12 complex and alpha lipoic acid.  If the pain develops or worsens we may consider gabapentin at that point.  She denies  any muscle weakness in her legs

## 2023-02-01 DIAGNOSIS — L309 Dermatitis, unspecified: Secondary | ICD-10-CM | POA: Diagnosis not present

## 2023-03-07 ENCOUNTER — Other Ambulatory Visit: Payer: Self-pay | Admitting: Family Medicine

## 2023-03-07 ENCOUNTER — Encounter: Payer: Self-pay | Admitting: Family Medicine

## 2023-03-07 ENCOUNTER — Telehealth: Payer: Self-pay

## 2023-03-07 ENCOUNTER — Other Ambulatory Visit: Payer: Self-pay

## 2023-03-07 ENCOUNTER — Ambulatory Visit (INDEPENDENT_AMBULATORY_CARE_PROVIDER_SITE_OTHER): Payer: Medicare Other | Admitting: Family Medicine

## 2023-03-07 VITALS — BP 132/72 | HR 88 | Temp 97.6°F | Ht 60.0 in | Wt 137.8 lb

## 2023-03-07 DIAGNOSIS — Z0001 Encounter for general adult medical examination with abnormal findings: Secondary | ICD-10-CM | POA: Diagnosis not present

## 2023-03-07 DIAGNOSIS — M8580 Other specified disorders of bone density and structure, unspecified site: Secondary | ICD-10-CM | POA: Diagnosis not present

## 2023-03-07 DIAGNOSIS — G629 Polyneuropathy, unspecified: Secondary | ICD-10-CM

## 2023-03-07 DIAGNOSIS — Z1211 Encounter for screening for malignant neoplasm of colon: Secondary | ICD-10-CM

## 2023-03-07 DIAGNOSIS — E7800 Pure hypercholesterolemia, unspecified: Secondary | ICD-10-CM | POA: Diagnosis not present

## 2023-03-07 DIAGNOSIS — Z0000 Encounter for general adult medical examination without abnormal findings: Secondary | ICD-10-CM

## 2023-03-07 DIAGNOSIS — I10 Essential (primary) hypertension: Secondary | ICD-10-CM | POA: Diagnosis not present

## 2023-03-07 DIAGNOSIS — R42 Dizziness and giddiness: Secondary | ICD-10-CM

## 2023-03-07 DIAGNOSIS — I779 Disorder of arteries and arterioles, unspecified: Secondary | ICD-10-CM

## 2023-03-07 MED ORDER — DICLOFENAC SODIUM 75 MG PO TBEC: DELAYED_RELEASE_TABLET | ORAL | 0 refills | Status: DC

## 2023-03-07 MED ORDER — ATORVASTATIN CALCIUM 40 MG PO TABS
40.0000 mg | ORAL_TABLET | Freq: Every day | ORAL | 1 refills | Status: DC
Start: 2023-03-07 — End: 2023-09-03

## 2023-03-07 MED ORDER — LOSARTAN POTASSIUM-HCTZ 50-12.5 MG PO TABS: 1.0000 | ORAL_TABLET | Freq: Every day | ORAL | 1 refills | Status: DC

## 2023-03-07 MED ORDER — MECLIZINE HCL 25 MG PO TABS: 25.0000 mg | ORAL_TABLET | Freq: Three times a day (TID) | ORAL | 1 refills | Status: DC | PRN

## 2023-03-07 NOTE — Progress Notes (Signed)
Subjective:    Patient ID: Cheryl Skinner, female    DOB: 05-May-1946, 77 y.o.   MRN: [id number]  HPI Patient is a 77 year old Caucasian woman here today for a complete physical exam.  She denies any concerns.  Due to her age she does not require Pap smear. Patient had a mammogram in March/2024.  Overdue for colonoscopy.  Last DEXA showed osteopenia in 2021, this is due as well.  She is due for a flu shot.  She is due for shingles shot.  She is due for a COVID shot.  She is due for RSV.  She denies any depression, falls, memory loss.  She does complain of neuropathy in her feet.  However, it is numbness.  She denies any pain or tingling or burning Immunization History  Administered Date(s) Administered   Influenza, High Dose Seasonal PF 05/04/2018   Influenza,inj,Quad PF,6+ Mos 02/25/2017, 05/11/2019   Pneumococcal Conjugate-13 10/11/2014   Pneumococcal Polysaccharide-23 05/27/2009    Past Medical History:  Diagnosis Date   Hyperlipidemia    Hypertension    Osteopenia    T -2.4 in hip 2021   TMJ (temporomandibular joint syndrome)    Past Surgical History:  Procedure Laterality Date   ABDOMINAL HYSTERECTOMY     Current Outpatient Medications on File Prior to Visit  Medication Sig Dispense Refill   albuterol (PROVENTIL HFA;VENTOLIN HFA) 108 (90 Base) MCG/ACT inhaler Inhale 2 puffs into the lungs every 4 (four) hours as needed for wheezing or shortness of breath. (Patient not taking: Reported on 08/24/2021) 1 Inhaler 0   atorvastatin (LIPITOR) 40 MG tablet Take 1 tablet by mouth once daily 90 tablet 0   cyclobenzaprine (FLEXERIL) 10 MG tablet Take 1 tablet by mouth three times daily as needed 30 tablet 0   diclofenac (VOLTAREN) 75 MG EC tablet Take 1 tablet by mouth twice daily as needed 180 tablet 0   fluticasone (FLONASE) 50 MCG/ACT nasal spray Place 2 sprays into both nostrils daily. (Patient not taking: Reported on 08/24/2021) 16 g 0   losartan-hydrochlorothiazide (HYZAAR) 50-12.5 MG  tablet Take 1 tablet by mouth daily. 90 tablet 3   meclizine (ANTIVERT) 25 MG tablet TAKE 1 TABLET BY MOUTH THREE TIMES DAILY AS NEEDED FOR DIZZINESS 30 tablet 0   No current facility-administered medications on file prior to visit.   Allergies  Allergen Reactions   Ampicillin     REACTION: makes sick   Codeine     REACTION: loses memory   Hydrocodone     REACTION: makes sick   Minocycline Hcl     REACTION: makes sick   Nitrofurantoin     REACTION: makes sick   Oxycodone Hcl     REACTION: loses memory   Propoxyphene Hcl     REACTION: makes sick   Propoxyphene N-Acetaminophen     REACTION: makes sick   Social History   Socioeconomic History   Marital status: Married    Spouse name: Not on file   Number of children: Not on file   Years of education: Not on file   Highest education level: Not on file  Occupational History   Not on file  Tobacco Use   Smoking status: Former    Types: Cigarettes   Smokeless tobacco: Never  Vaping Use   Vaping status: Never Used  Substance and Sexual Activity   Alcohol use: Yes    Comment: Rare   Drug use: No   Sexual activity: Not on file  Other Topics  Concern   Not on file  Social History Narrative   Not on file   Social Determinants of Health   Financial Resource Strain: Not on file  Food Insecurity: Not on file  Transportation Needs: Not on file  Physical Activity: Not on file  Stress: Not on file  Social Connections: Not on file  Intimate Partner Violence: Not on file     Review of Systems  All other systems reviewed and are negative.      Objective:   Physical Exam Constitutional:      General: She is not in acute distress.    Appearance: Normal appearance. She is normal weight. She is not ill-appearing or toxic-appearing.  Neck:     Vascular: Carotid bruit present.  Cardiovascular:     Rate and Rhythm: Normal rate. Rhythm irregular.     Pulses: Normal pulses.     Heart sounds: Normal heart sounds. No  murmur heard.    No gallop.  Pulmonary:     Effort: Pulmonary effort is normal. No respiratory distress.     Breath sounds: Normal breath sounds. No stridor. No wheezing, rhonchi or rales.  Abdominal:     General: Abdomen is flat. Bowel sounds are normal. There is no distension.     Palpations: Abdomen is soft.     Tenderness: There is no abdominal tenderness. There is no guarding.  Musculoskeletal:     Right lower leg: No edema.     Left lower leg: No edema.  Neurological:     Mental Status: She is alert.           Assessment & Plan:  Essential hypertension - Plan: CBC with Differential/Platelet, COMPLETE METABOLIC PANEL WITH GFR, Lipid panel, TSH  Pure hypercholesterolemia - Plan: CBC with Differential/Platelet, COMPLETE METABOLIC PANEL WITH GFR, Lipid panel, TSH  Bilateral carotid artery disease, unspecified type (HCC) - Plan: CBC with Differential/Platelet, COMPLETE METABOLIC PANEL WITH GFR, Lipid panel, TSH  Neuropathy - Plan: CBC with Differential/Platelet, COMPLETE METABOLIC PANEL WITH GFR, Lipid panel, TSH  General medical exam - Plan: CBC with Differential/Platelet, COMPLETE METABOLIC PANEL WITH GFR, Lipid panel, TSH  Osteopenia, unspecified location - Plan: DG Bone Density  Colon cancer screening - Plan: Cologuard Patient is due for a flu shot, shingles shot, and a COVID booster.  She defers all shots today.  I did discuss RSV with her as well.  Mammogram is up-to-date.  She does not require Pap smear.  We discussed colonoscopy.  She does not want to do a colonoscopy but she would like to do Cologuard screening for colon cancer.  She has a history of osteopenia.  She is 3 years out from her last bone density test.  I will schedule this for her.  I will check a CBC a CMP and a lipid panel today along with a TSH.  Blood pressure is excellent.  Regular anticipatory guidance is provided

## 2023-03-07 NOTE — Telephone Encounter (Signed)
Pt was seen for cpe this morning (03/07/23) and forgot to mention to pcp that she is out of most of her meds and have no refills. Pt would like for these meds to be sent into pharmacy for refills please.  atorvastatin (LIPITOR) 40 MG tablet [[id number]] cyclobenzaprine (FLEXERIL) 10 MG tablet diclofenac (VOLTAREN) 75 MG EC tablet losartan-hydrochlorothiazide (HYZAAR) 50-12.5 MG tablet meclizine (ANTIVERT) 25 MG tablet   PHARMACY: [facility] [address], [place] - [address] [place] [address] Phone: [phone]  Fax: [phone]  CB#: [phone]

## 2023-03-08 LAB — LIPID PANEL
Cholesterol: 173 mg/dL (ref <200)
HDL: 50 mg/dL (ref >=50)
LDL Cholesterol (Calc): 98 mg/dL
Non-HDL Cholesterol (Calc): 123 mg/dL (ref <130)
Total CHOL/HDL Ratio: 3.5 (calc) (ref <5.0)
Triglycerides: 155 mg/dL — ABNORMAL HIGH (ref <150)

## 2023-03-08 LAB — COMPLETE METABOLIC PANEL WITH GFR
AG Ratio: 1.7 (calc) (ref 1.0–2.5)
ALT: 19 U/L (ref 6–29)
AST: 18 U/L (ref 10–35)
Albumin: 4.1 g/dL (ref 3.6–5.1)
Alkaline phosphatase (APISO): 62 U/L (ref 37–153)
BUN: 18 mg/dL (ref 7–25)
CO2: 29 mmol/L (ref 20–32)
Calcium: 9.8 mg/dL (ref 8.6–10.4)
Chloride: 103 mmol/L (ref 98–110)
Creat: 0.77 mg/dL (ref 0.60–1.00)
Globulin: 2.4 g/dL (ref 1.9–3.7)
Glucose, Bld: 89 mg/dL (ref 65–99)
Potassium: 4.1 mmol/L (ref 3.5–5.3)
Sodium: 141 mmol/L (ref 135–146)
Total Bilirubin: 0.6 mg/dL (ref 0.2–1.2)
Total Protein: 6.5 g/dL (ref 6.1–8.1)
eGFR: 79 mL/min/{1.73_m2} (ref >=60)

## 2023-03-08 LAB — CBC WITH DIFFERENTIAL/PLATELET
Absolute Monocytes: 592 {cells}/uL (ref 200–950)
Basophils Absolute: 70 {cells}/uL (ref 0–200)
Basophils Relative: 1.2 %
Eosinophils Absolute: 191 {cells}/uL (ref 15–500)
Eosinophils Relative: 3.3 %
HCT: 43.2 % (ref 35.0–45.0)
Hemoglobin: 14 g/dL (ref 11.7–15.5)
Lymphs Abs: 2192 {cells}/uL (ref 850–3900)
MCH: 29.6 pg (ref 27.0–33.0)
MCHC: 32.4 g/dL (ref 32.0–36.0)
MCV: 91.3 fL (ref 80.0–100.0)
MPV: 12 fL (ref 7.5–12.5)
Monocytes Relative: 10.2 %
Neutro Abs: 2755 {cells}/uL (ref 1500–7800)
Neutrophils Relative %: 47.5 %
Platelets: 251 10*3/uL (ref 140–400)
RBC: 4.73 10*6/uL (ref 3.80–5.10)
RDW: 12.1 % (ref 11.0–15.0)
Total Lymphocyte: 37.8 %
WBC: 5.8 10*3/uL (ref 3.8–10.8)

## 2023-03-08 LAB — TSH: TSH: 3.44 m[IU]/L (ref 0.40–4.50)

## 2023-03-26 DIAGNOSIS — Z1211 Encounter for screening for malignant neoplasm of colon: Secondary | ICD-10-CM | POA: Diagnosis not present

## 2023-04-04 LAB — COLOGUARD: COLOGUARD: POSITIVE — AB

## 2023-04-14 ENCOUNTER — Other Ambulatory Visit: Payer: Self-pay

## 2023-04-14 ENCOUNTER — Telehealth: Payer: Self-pay

## 2023-04-14 DIAGNOSIS — R195 Other fecal abnormalities: Secondary | ICD-10-CM

## 2023-04-14 NOTE — Telephone Encounter (Signed)
Copied from CRM [id number]. Topic: Clinical - Lab/Test Results >> Apr 14, 2023 10:49 AM Dimitri Ped wrote: Reason for CRM: patient is returning call concerning results for cologuard. Read the message from Ms. Erskine Squibb and patient say she doesn't have a Gastroenterologist in mind at the moment. Requesting a call back concerning results. Call back number [phone]

## 2023-04-15 ENCOUNTER — Encounter: Payer: Self-pay | Admitting: Physician Assistant

## 2023-04-28 ENCOUNTER — Telehealth: Payer: Self-pay

## 2023-04-28 NOTE — Telephone Encounter (Signed)
Copied from CRM [id number]. Topic: Clinical - Medical Advice >> Apr 28, 2023 10:17 AM Clayton Bibles wrote: Reason for CRM: PT wants Cheryl Skinner to call Yael about colon test results. She has an appointment in Feb with Dr Corinda Gubler and she wants to know if she can get one sooner with another doctor. Please call at [phone].

## 2023-06-02 ENCOUNTER — Other Ambulatory Visit: Payer: Self-pay | Admitting: Family Medicine

## 2023-06-06 ENCOUNTER — Encounter: Payer: Self-pay | Admitting: Family Medicine

## 2023-06-06 ENCOUNTER — Ambulatory Visit (INDEPENDENT_AMBULATORY_CARE_PROVIDER_SITE_OTHER): Payer: Medicare Other | Admitting: Family Medicine

## 2023-06-06 VITALS — BP 146/78 | HR 87 | Temp 98.2°F | Ht 60.0 in | Wt 137.0 lb

## 2023-06-06 DIAGNOSIS — Z23 Encounter for immunization: Secondary | ICD-10-CM | POA: Diagnosis not present

## 2023-06-06 DIAGNOSIS — R195 Other fecal abnormalities: Secondary | ICD-10-CM

## 2023-06-06 NOTE — Progress Notes (Signed)
 Subjective:    Patient ID: Cheryl Skinner, female    DOB: 24-Sep-1945, 78 y.o.   MRN: [id number]  HPI Patient is here today to discuss her Cologuard results.  She had a positive Cologuard in November.  I recommended a colonoscopy.  She has an appointment to see them on January 14.  She denies any blood in her stool or abdominal pain or nausea or vomiting.  She is due for a flu shot. Past Medical History:  Diagnosis Date   Hyperlipidemia    Hypertension    Osteopenia    T -2.4 in hip 2021   TMJ (temporomandibular joint syndrome)    Past Surgical History:  Procedure Laterality Date   ABDOMINAL HYSTERECTOMY     Current Outpatient Medications on File Prior to Visit  Medication Sig Dispense Refill   atorvastatin  (LIPITOR) 40 MG tablet Take 1 tablet (40 mg total) by mouth daily. 90 tablet 1   cyclobenzaprine  (FLEXERIL ) 10 MG tablet Take 1 tablet by mouth three times daily as needed 30 tablet 0   diclofenac  (VOLTAREN ) 75 MG EC tablet Take 1 tablet by mouth twice daily as needed 180 tablet 0   fluticasone  (FLONASE ) 50 MCG/ACT nasal spray Place 2 sprays into both nostrils daily. 16 g 0   losartan -hydrochlorothiazide  (HYZAAR) 50-12.5 MG tablet Take 1 tablet by mouth daily. 90 tablet 1   meclizine  (ANTIVERT ) 25 MG tablet Take 1 tablet (25 mg total) by mouth 3 (three) times daily as needed for dizziness. 30 tablet 1   albuterol  (PROVENTIL  HFA;VENTOLIN  HFA) 108 (90 Base) MCG/ACT inhaler Inhale 2 puffs into the lungs every 4 (four) hours as needed for wheezing or shortness of breath. (Patient not taking: Reported on 06/06/2023) 1 Inhaler 0   No current facility-administered medications on file prior to visit.   Allergies  Allergen Reactions   Ampicillin     REACTION: makes sick   Codeine     REACTION: loses memory   Hydrocodone     REACTION: makes sick   Minocycline Hcl     REACTION: makes sick   Nitrofurantoin     REACTION: makes sick   Oxycodone Hcl     REACTION: loses memory    Propoxyphene Hcl     REACTION: makes sick   Propoxyphene N-Acetaminophen     REACTION: makes sick   Social History   Socioeconomic History   Marital status: Married    Spouse name: Not on file   Number of children: Not on file   Years of education: Not on file   Highest education level: Not on file  Occupational History   Not on file  Tobacco Use   Smoking status: Former    Types: Cigarettes   Smokeless tobacco: Never  Vaping Use   Vaping status: Never Used  Substance and Sexual Activity   Alcohol use: Yes    Comment: Rare   Drug use: No   Sexual activity: Not on file  Other Topics Concern   Not on file  Social History Narrative   Not on file   Social Drivers of Health   Financial Resource Strain: Not on file  Food Insecurity: Not on file  Transportation Needs: Not on file  Physical Activity: Not on file  Stress: Not on file  Social Connections: Not on file  Intimate Partner Violence: Not on file     Review of Systems  All other systems reviewed and are negative.      Objective:   Physical  Exam Constitutional:      General: She is not in acute distress.    Appearance: Normal appearance. She is normal weight. She is not ill-appearing or toxic-appearing.  Cardiovascular:     Rate and Rhythm: Normal rate and regular rhythm.     Pulses: Normal pulses.     Heart sounds: Normal heart sounds. No murmur heard.    No gallop.  Pulmonary:     Effort: Pulmonary effort is normal. No respiratory distress.     Breath sounds: Normal breath sounds. No stridor. No wheezing, rhonchi or rales.  Neurological:     Mental Status: She is alert.           Assessment & Plan:  Positive colorectal cancer screening using Cologuard test Strongly recommended a colonoscopy.  I explained that this could be a false positive.  However at this point we need to evaluate further for her own safety.  Patient agrees.  She also received her flu shot today.

## 2023-06-06 NOTE — Addendum Note (Signed)
 Addended by: Venia Carbon K on: 06/06/2023 02:13 PM   Modules accepted: Orders

## 2023-06-09 NOTE — Progress Notes (Addendum)
 Chief Complaint: Positive Cologuard Primary GI Doctor: (previously Dr. Obie) Dr. Suzann  HPI: Patient is a 78 year old female patient with past medical history of hyperlipidemia, hypertension, and osteopenia, who was referred to me by Duanne Butler DASEN, MD on 04/14/23 for positive cologuard.  Interval History   Patient denies GERD or dysphagia. Patient denies nausea, vomiting, or weight loss. Patient denies altered bowel habits, abdominal pain, or rectal bleeding. Patient on daily ASA 81 mg. Social drinker, very seldom. No tobacco use. Patients last colonoscopy in 2011. Patient's family history includes mother with breast CA.  Wt Readings from Last 3 Encounters:  06/10/23 136 lb (61.7 kg)  06/06/23 137 lb (62.1 kg)  03/07/23 137 lb 12.8 oz (62.5 kg)   Past Medical History:  Diagnosis Date   Hyperlipidemia    Hypertension    Osteopenia    T -2.4 in hip 2021   TMJ (temporomandibular joint syndrome)    Past Surgical History:  Procedure Laterality Date   ABDOMINAL HYSTERECTOMY     Current Outpatient Medications  Medication Sig Dispense Refill   atorvastatin  (LIPITOR) 40 MG tablet Take 1 tablet (40 mg total) by mouth daily. 90 tablet 1   cyclobenzaprine  (FLEXERIL ) 10 MG tablet Take 1 tablet by mouth three times daily as needed 30 tablet 0   diclofenac  (VOLTAREN ) 75 MG EC tablet Take 1 tablet by mouth twice daily as needed 180 tablet 0   losartan -hydrochlorothiazide  (HYZAAR) 50-12.5 MG tablet Take 1 tablet by mouth daily. 90 tablet 1   meclizine  (ANTIVERT ) 25 MG tablet Take 1 tablet (25 mg total) by mouth 3 (three) times daily as needed for dizziness. 30 tablet 1   No current facility-administered medications for this visit.   Allergies as of 06/10/2023 - Review Complete 06/10/2023  Allergen Reaction Noted   Ampicillin  02/01/2010   Codeine  02/01/2010   Hydrocodone  02/01/2010   Minocycline hcl  02/01/2010   Nitrofurantoin  02/01/2010   Oxycodone hcl  02/01/2010    Propoxyphene hcl  02/01/2010   Propoxyphene n-acetaminophen  02/01/2010   History reviewed. No pertinent family history. Review of Systems:    Constitutional: No weight loss, fever, chills, weakness or fatigue HEENT: Eyes: No change in vision               Ears, Nose, Throat:  No change in hearing or congestion Skin: No rash or itching Cardiovascular: No chest pain, chest pressure or palpitations   Respiratory: No SOB or cough Gastrointestinal: See HPI and otherwise negative Genitourinary: No dysuria or change in urinary frequency Neurological: No headache, dizziness or syncope Musculoskeletal: No new muscle or joint pain Hematologic: No bleeding or bruising Psychiatric: No history of depression or anxiety   Physical Exam:  Vital signs: BP 138/88   Pulse 88   Ht 5' (1.524 m)   Wt 136 lb (61.7 kg)   BMI 26.56 kg/m   Constitutional: Pleasant Caucasian female appears to be in NAD, Well developed, Well nourished, alert and cooperative Throat: Oral cavity and pharynx without inflammation, swelling or lesion.  Respiratory: Respirations even and unlabored. Lungs clear to auscultation bilaterally.   No wheezes, crackles, or rhonchi.  Cardiovascular: Normal S1, S2. Regular rate and rhythm. No peripheral edema, cyanosis or pallor.  Gastrointestinal:  Soft, nondistended, nontender. No rebound or guarding. Normal bowel sounds. No appreciable masses or hepatomegaly. Rectal:  Not performed.  Skin:   Dry and intact without significant lesions or rashes. Psychiatric: Oriented to person, place and time.  Demonstrates good judgement and reason without abnormal affect or behaviors.  RELEVANT LABS AND IMAGING: CBC    Latest Ref Rng & Units 03/07/2023   10:04 AM 08/24/2021    8:28 AM 12/14/2020   10:45 AM  CBC  WBC 3.8 - 10.8 Thousand/uL 5.8  6.0  4.9   Hemoglobin 11.7 - 15.5 g/dL 85.9  86.7  87.6   Hematocrit 35.0 - 45.0 % 43.2  39.9  38.0   Platelets 140 - 400 Thousand/uL 251  204  243      CMP     Latest Ref Rng & Units 03/07/2023   10:04 AM 08/24/2021    8:28 AM 12/14/2020   10:45 AM  CMP  Glucose 65 - 99 mg/dL 89  95  85   BUN 7 - 25 mg/dL 18  23  17    Creatinine 0.60 - 1.00 mg/dL 9.22  9.08  9.17   Sodium 135 - 146 mmol/L 141  140  141   Potassium 3.5 - 5.3 mmol/L 4.1  4.2  4.2   Chloride 98 - 110 mmol/L 103  105  106   CO2 20 - 32 mmol/L 29  26  29    Calcium  8.6 - 10.4 mg/dL 9.8  9.3  89.8   Total Protein 6.1 - 8.1 g/dL 6.5  6.3  6.3   Total Bilirubin 0.2 - 1.2 mg/dL 0.6  0.4  0.5   AST 10 - 35 U/L 18  21  19    ALT 6 - 29 U/L 19  27  17      Lab Results  Component Value Date   TSH 3.44 03/07/2023  03/26/23 Cologuard positive  02/15/10 colonoscopy with Dr. Obie No polyps or cancers. Normal colonoscopy.  Assessment: Encounter Diagnosis  Name Primary?   Positive colorectal cancer screening using Cologuard test Yes   78 year old female patient with positive Cologuard test who presents to schedule colonoscopy. Last colonoscopy was in 2011 and normal. Will schedule today.  Plan: -Schedule a colonoscopy in [facility] with Dr. Suzann. The risks and benefits of colonoscopy with possible polypectomy / biopsies were discussed and the patient agrees to proceed.  -Follow-up prn.  Thank you for the courtesy of this consult. Please call me with any questions or concerns.   Orville Widmann, FNP-C [facility] 06/10/2023, 2:07 PM  Cc: Duanne Butler DASEN, MD  I have reviewed the clinic note as outlined by Cathryne Beal, NP and agree with the assessment, plan and medical decision making. Ms. Vincent presents for evaluation of positive Cologuard.  Last colonoscopy was performed in 2011 and was normal.  No pertinent family history of colorectal cancer.  Denies active GI symptoms.  No anemia on labs.  Agree with proceeding with colonoscopy for evaluation of positive Cologuard.  Inocente Suzann, MD

## 2023-06-10 ENCOUNTER — Ambulatory Visit: Payer: Medicare Other | Admitting: Gastroenterology

## 2023-06-10 ENCOUNTER — Encounter: Payer: Self-pay | Admitting: Gastroenterology

## 2023-06-10 VITALS — BP 138/88 | HR 88 | Ht 60.0 in | Wt 136.0 lb

## 2023-06-10 DIAGNOSIS — R195 Other fecal abnormalities: Secondary | ICD-10-CM | POA: Diagnosis not present

## 2023-06-10 MED ORDER — SUTAB 1479-225-188 MG PO TABS
24.0000 | ORAL_TABLET | Freq: Once | ORAL | 0 refills | Status: AC
Start: 2023-06-10 — End: 2023-06-10

## 2023-06-10 NOTE — Patient Instructions (Signed)
 You have been scheduled for a colonoscopy. Please follow written instructions given to you at your visit today.   Please pick up your prep supplies at the pharmacy within the next 1-3 days.  If you use inhalers (even only as needed), please bring them with you on the day of your procedure.  DO NOT TAKE 7 DAYS PRIOR TO TEST- Trulicity (dulaglutide) Ozempic, Wegovy (semaglutide) Mounjaro (tirzepatide) Bydureon Bcise (exanatide extended release)  DO NOT TAKE 1 DAY PRIOR TO YOUR TEST Rybelsus (semaglutide) Adlyxin (lixisenatide) Victoza (liraglutide) Byetta (exanatide) ___________________________________________________________________________ Bonita Quin will receive your bowel preparation through [facility], which ensures the lowest copay and home delivery, with outreach via text or call from an [phone] number. Please respond promptly to avoid rescheduling. If you are interested in alternative options or have any questions please contact them at [phone]  Your Provider Has Sent Your Bowel Prep Regimen To [facility] What to expect. [facility] will contact you to verify your information and collect your copay, if applicable. Enjoy the comfort of your home while we deliver your prescription to you, free of any shipping charges. Fast, FREE delivery or shipping. [facility] accepts all major insurance benefits and applies discounts & coupons  Have additional questions? [facility]'s patient care team is always here to help.  Chat: [email or web address] Call: [phone] Email: [email or web address] NCPDP: [id number] How will we contact you? Welcome Phone call  a Welcome text and a Checkout link in a text Texts you receive from [phone] Are Not Spam.   *To set up delivery, you must complete the checkout process via link or speak to one of our patient care representatives. If we are unable to reach you, your prescription may be delayed.

## 2023-06-18 DIAGNOSIS — Z85828 Personal history of other malignant neoplasm of skin: Secondary | ICD-10-CM | POA: Diagnosis not present

## 2023-06-18 DIAGNOSIS — L814 Other melanin hyperpigmentation: Secondary | ICD-10-CM | POA: Diagnosis not present

## 2023-06-18 DIAGNOSIS — D1730 Benign lipomatous neoplasm of skin and subcutaneous tissue of unspecified sites: Secondary | ICD-10-CM | POA: Diagnosis not present

## 2023-06-18 DIAGNOSIS — L821 Other seborrheic keratosis: Secondary | ICD-10-CM | POA: Diagnosis not present

## 2023-06-18 DIAGNOSIS — D225 Melanocytic nevi of trunk: Secondary | ICD-10-CM | POA: Diagnosis not present

## 2023-06-18 NOTE — Progress Notes (Unsigned)
[facility] History and Physical   Primary Care Physician:  Donita Brooks, MD   Reason for Procedure:  Positive Cologuard  Plan:    Colonoscopy     HPI: Cheryl Skinner is a 78 y.o. female undergoing colonoscopy for evaluation of positive Cologuard 02/2023.  Denies any active GI symptoms.  Last colonoscopy was performed in 2011 and was normal.  No family history of colorectal cancer.  Patient does take aspirin 81 mg orally daily.   Past Medical History:  Diagnosis Date   Hyperlipidemia    Hypertension    Osteopenia    T -2.4 in hip 2021   TMJ (temporomandibular joint syndrome)     Past Surgical History:  Procedure Laterality Date   ABDOMINAL HYSTERECTOMY      Prior to Admission medications   Medication Sig Start Date End Date Taking? Authorizing Provider  atorvastatin (LIPITOR) 40 MG tablet Take 1 tablet (40 mg total) by mouth daily. 03/07/23   Donita Brooks, MD  cyclobenzaprine (FLEXERIL) 10 MG tablet Take 1 tablet by mouth three times daily as needed 12/05/22   Donita Brooks, MD  diclofenac (VOLTAREN) 75 MG EC tablet Take 1 tablet by mouth twice daily as needed 06/03/23   Donita Brooks, MD  losartan-hydrochlorothiazide (HYZAAR) 50-12.5 MG tablet Take 1 tablet by mouth daily. 03/07/23   Donita Brooks, MD  meclizine (ANTIVERT) 25 MG tablet Take 1 tablet (25 mg total) by mouth 3 (three) times daily as needed for dizziness. 03/07/23   Donita Brooks, MD    Current Outpatient Medications  Medication Sig Dispense Refill   atorvastatin (LIPITOR) 40 MG tablet Take 1 tablet (40 mg total) by mouth daily. 90 tablet 1   cyclobenzaprine (FLEXERIL) 10 MG tablet Take 1 tablet by mouth three times daily as needed 30 tablet 0   diclofenac (VOLTAREN) 75 MG EC tablet Take 1 tablet by mouth twice daily as needed 180 tablet 0   losartan-hydrochlorothiazide (HYZAAR) 50-12.5 MG tablet Take 1 tablet by mouth daily. 90 tablet 1   meclizine (ANTIVERT) 25 MG tablet  Take 1 tablet (25 mg total) by mouth 3 (three) times daily as needed for dizziness. 30 tablet 1   No current facility-administered medications for this visit.    Allergies as of 06/19/2023 - Review Complete 06/10/2023  Allergen Reaction Noted   Ampicillin  02/01/2010   Codeine  02/01/2010   Hydrocodone  02/01/2010   Minocycline hcl  02/01/2010   Nitrofurantoin  02/01/2010   Oxycodone hcl  02/01/2010   Propoxyphene hcl  02/01/2010   Propoxyphene n-acetaminophen  02/01/2010    No family history on file.  Social History   Socioeconomic History   Marital status: Married    Spouse name: Not on file   Number of children: Not on file   Years of education: Not on file   Highest education level: Not on file  Occupational History   Not on file  Tobacco Use   Smoking status: Former    Types: Cigarettes   Smokeless tobacco: Never  Vaping Use   Vaping status: Never Used  Substance and Sexual Activity   Alcohol use: Yes    Comment: Rare   Drug use: No   Sexual activity: Not on file  Other Topics Concern   Not on file  Social History Narrative   Not on file   Social Drivers of Health   Financial Resource Strain: Not on file  Food Insecurity: Not on file  Transportation Needs: Not on file  Physical Activity: Not on file  Stress: Not on file  Social Connections: Not on file  Intimate Partner Violence: Not on file    Review of Systems:  All other review of systems negative except as mentioned in the HPI.  Physical Exam: Vital signs There were no vitals taken for this visit.  General:   Alert,  Well-developed, well-nourished, pleasant and cooperative in NAD Airway:  Mallampati  Lungs:  Clear throughout to auscultation.   Heart:  Regular rate and rhythm; no murmurs, clicks, rubs,  or gallops. Abdomen:  Soft, nontender and nondistended. Normal bowel sounds.   Neuro/Psych:  Normal mood and affect. A and O x 3  Maren Beach, MD [facility]

## 2023-06-19 ENCOUNTER — Ambulatory Visit: Payer: Medicare Other | Admitting: Pediatrics

## 2023-06-19 ENCOUNTER — Encounter: Payer: Self-pay | Admitting: Pediatrics

## 2023-06-19 VITALS — BP 136/64 | HR 97 | Temp 97.4°F | Resp 16 | Ht 60.0 in | Wt 136.0 lb

## 2023-06-19 DIAGNOSIS — R195 Other fecal abnormalities: Secondary | ICD-10-CM

## 2023-06-19 DIAGNOSIS — K648 Other hemorrhoids: Secondary | ICD-10-CM

## 2023-06-19 DIAGNOSIS — K5730 Diverticulosis of large intestine without perforation or abscess without bleeding: Secondary | ICD-10-CM

## 2023-06-19 DIAGNOSIS — Z1211 Encounter for screening for malignant neoplasm of colon: Secondary | ICD-10-CM | POA: Diagnosis not present

## 2023-06-19 MED ORDER — SODIUM CHLORIDE 0.9 % IV SOLN: 500.0000 mL | Freq: Once | INTRAVENOUS | Status: DC

## 2023-06-19 NOTE — Op Note (Addendum)
[facility] Patient Name: Cheryl Skinner Procedure Date: 06/19/2023 3:06 PM MRN: [id number] Endoscopist: Maren Beach , MD, [id number] Age: 78 Referring MD:  Date of Birth: [birth year] Gender: Female Account #: [id number] Procedure:                Colonoscopy Indications:              Last colonoscopy: 2011, Positive Cologuard test Medicines:                Monitored Anesthesia Care Procedure:                Pre-Anesthesia Assessment:                           - Prior to the procedure, a History and Physical                            was performed, and patient medications and                            allergies were reviewed. The patient's tolerance of                            previous anesthesia was also reviewed. The risks                            and benefits of the procedure and the sedation                            options and risks were discussed with the patient.                            All questions were answered, and informed consent                            was obtained. Prior Anticoagulants: The patient has                            taken no anticoagulant or antiplatelet agents. ASA                            Grade Assessment: II - A patient with mild systemic                            disease. After reviewing the risks and benefits,                            the patient was deemed in satisfactory condition to                            undergo the procedure.                           After obtaining informed consent, the colonoscope  was passed under direct vision. Throughout the                            procedure, the patient's blood pressure, pulse, and                            oxygen saturations were monitored continuously. The                            Colonoscope was introduced through the anus and                            advanced to the cecum, identified by appendiceal                            orifice and  ileocecal valve. The colonoscopy was                            somewhat difficult due to restricted mobility of                            the colon and a tortuous colon. Successful                            completion of the procedure was aided by changing                            the patient to a supine position and withdrawing                            the scope and replacing with the pediatric                            colonoscope. The patient tolerated the procedure                            fairly well. The quality of the bowel preparation                            was evaluated using the BBPS [facility] Bowel                            Preparation Scale) with scores of: Right Colon = 3,                            Transverse Colon = 3 and Left Colon = 3 (entire                            mucosa seen well with no residual staining, small                            fragments of stool or opaque liquid). The total  BBPS score equals 9. Scope In: 3:20:54 PM Scope Out: 3:39:19 PM Scope Withdrawal Time: 0 hours 10 minutes 3 seconds  Total Procedure Duration: 0 hours 18 minutes 25 seconds  Findings:                 The perianal and digital rectal examinations were                            normal. Pertinent negatives include normal                            sphincter tone and no palpable rectal lesions.                           A few small-mouthed diverticula were found in the                            sigmoid colon.                           The colon (entire examined portion) appeared normal.                           Internal hemorrhoids were found during retroflexion. Complications:            No immediate complications. Estimated blood loss:                            None. Estimated Blood Loss:     Estimated blood loss: none. Impression:               - Diverticulosis in the sigmoid colon.                           - The entire examined colon is  normal.                           - Internal hemorrhoids.                           - No specimens collected. Recommendation:           - Discharge patient to home (ambulatory).                           - The findings and recommendations were discussed                            with the patient's family.                           - Return to referring physician.                           - Patient has a contact number available for                            emergencies. The signs and symptoms of  potential                            delayed complications were discussed with the                            patient. Return to normal activities tomorrow.                            Written discharge instructions were provided to the                            patient. Dellie Burns, MD 06/19/2023 3:44:44 PM This report has been signed electronically. Addendum Number: 1   Addendum Date: 06/19/2023 3:55:41 PM      Colonoscope changed to Pediatric scope number [address] Maren Beach, MD 06/19/2023 3:56:16 PM This report has been signed electronically.

## 2023-06-19 NOTE — Progress Notes (Signed)
1530 pt started wretching.  (Cecum was 1529, no abd pressure was needed but extended time and rolled to back for a bit).  Sedation stopped.  O2 to max. Suction started.  Initial fluid from mouth was clear.  !533 fluid turned bile colored.  Steep t-burg added. Pt allowed to wake up,Sats never dropped.  Small amount in cannister

## 2023-06-19 NOTE — Patient Instructions (Signed)
Handouts provided about diverticulosis, and hemorrhoids.  Return to referring physician.  YOU HAD AN ENDOSCOPIC PROCEDURE TODAY AT THE [facility]:   Refer to the procedure report that was given to you for any specific questions about what was found during the examination.  If the procedure report does not answer your questions, please call your gastroenterologist to clarify.  If you requested that your care partner not be given the details of your procedure findings, then the procedure report has been included in a sealed envelope for you to review at your convenience later.  YOU SHOULD EXPECT: Some feelings of bloating in the abdomen. Passage of more gas than usual.  Walking can help get rid of the air that was put into your GI tract during the procedure and reduce the bloating. If you had a lower endoscopy (such as a colonoscopy or flexible sigmoidoscopy) you may notice spotting of blood in your stool or on the toilet paper. If you underwent a bowel prep for your procedure, you may not have a normal bowel movement for a few days.  Please Note:  You might notice some irritation and congestion in your nose or some drainage.  This is from the oxygen used during your procedure.  There is no need for concern and it should clear up in a day or so.  SYMPTOMS TO REPORT IMMEDIATELY:  Following lower endoscopy (colonoscopy or flexible sigmoidoscopy):  Excessive amounts of blood in the stool  Significant tenderness or worsening of abdominal pains  Swelling of the abdomen that is new, acute  Fever of 100F or higher   For urgent or emergent issues, a gastroenterologist can be reached at any hour by calling [phone]. Do not use MyChart messaging for urgent concerns.    DIET:  We do recommend a small meal at first, but then you may proceed to your regular diet.  Drink plenty of fluids but you should avoid alcoholic beverages for 24 hours.  ACTIVITY:  You should plan to take it easy  for the rest of today and you should NOT DRIVE or use heavy machinery until tomorrow (because of the sedation medicines used during the test).    FOLLOW UP: Our staff will call the number listed on your records the next business day following your procedure.  We will call around 7:15- 8:00 am to check on you and address any questions or concerns that you may have regarding the information given to you following your procedure. If we do not reach you, we will leave a message.     If any biopsies were taken you will be contacted by phone or by letter within the next 1-3 weeks.  Please call us at [phone] if you have not heard about the biopsies in 3 weeks.    SIGNATURES/CONFIDENTIALITY: You and/or your care partner have signed paperwork which will be entered into your electronic medical record.  These signatures attest to the fact that that the information above on your After Visit Summary has been reviewed and is understood.  Full responsibility of the confidentiality of this discharge information lies with you and/or your care-partner.

## 2023-06-19 NOTE — Progress Notes (Signed)
Report to PACU, RN, vss, BBS= Clear.  

## 2023-06-20 ENCOUNTER — Telehealth: Payer: Self-pay

## 2023-06-20 NOTE — Telephone Encounter (Signed)
  Follow up Call-     06/19/2023    2:40 PM  Call back number  Post procedure Call Back phone  # [phone]  Permission to leave phone message Yes     Patient questions:  Do you have a fever, pain , or abdominal swelling? No. Pain Score  0 *  Have you tolerated food without any problems? Yes.    Have you been able to return to your normal activities? Yes.    Do you have any questions about your discharge instructions: Diet   No. Medications  No. Follow up visit  No.  Do you have questions or concerns about your Care? No.  Actions: * If pain score is 4 or above: No action needed, pain <4.

## 2023-07-04 ENCOUNTER — Ambulatory Visit: Payer: Medicare Other | Admitting: Physician Assistant

## 2023-08-20 DIAGNOSIS — Z1231 Encounter for screening mammogram for malignant neoplasm of breast: Secondary | ICD-10-CM | POA: Diagnosis not present

## 2023-08-20 LAB — HM MAMMOGRAPHY

## 2023-09-02 ENCOUNTER — Other Ambulatory Visit: Payer: Self-pay | Admitting: Family Medicine

## 2023-09-02 DIAGNOSIS — E7800 Pure hypercholesterolemia, unspecified: Secondary | ICD-10-CM

## 2023-09-03 NOTE — Telephone Encounter (Signed)
 Requested by interface surescripts. Last labs 03/07/23.  Requested Prescriptions  Pending Prescriptions Disp Refills   atorvastatin (LIPITOR) 40 MG tablet [Pharmacy Med Name: Atorvastatin Calcium 40 MG Oral Tablet] 90 tablet 0    Sig: Take 1 tablet by mouth once daily     Cardiovascular:  Antilipid - Statins Failed - 09/03/2023  2:30 PM      Failed - Lipid Panel in normal range within the last 12 months    Cholesterol  Date Value Ref Range Status  03/07/2023 173 <200 mg/dL Final   LDL Cholesterol (Calc)  Date Value Ref Range Status  03/07/2023 98 mg/dL (calc) Final    Comment:    Reference range: <100 . Desirable range <100 mg/dL for primary prevention;   <70 mg/dL for patients with CHD or diabetic patients  with > or = 2 CHD risk factors. Marland Kitchen LDL-C is now calculated using the Martin-Hopkins  calculation, which is a validated novel method providing  better accuracy than the Friedewald equation in the  estimation of LDL-C.  Horald Pollen et al. Lenox Ahr. 1610;960(45): 2061-2068  (http://education.[facility].com/faq/FAQ164)    HDL  Date Value Ref Range Status  03/07/2023 50 > OR = 50 mg/dL Final   Triglycerides  Date Value Ref Range Status  03/07/2023 155 (H) <150 mg/dL Final         Passed - Patient is not pregnant      Passed - Valid encounter within last 12 months    Recent Outpatient Visits           2 months ago Positive colorectal cancer screening using Cologuard test   [place] [facility] Donita Brooks, MD   6 months ago Essential hypertension   [place] [facility] Donita Brooks, MD   7 months ago Neuropathy   [place] [facility] Pickard, Priscille Heidelberg, MD               diclofenac (VOLTAREN) 75 MG EC tablet [Pharmacy Med Name: Diclofenac Sodium 75 MG Oral Tablet Delayed Release] 180 tablet 0    Sig: Take 1 tablet by mouth twice daily as needed     Analgesics:  NSAIDS Failed - 09/03/2023  2:30  PM      Failed - Manual Review: Labs are only required if the patient has taken medication for more than 8 weeks.      Passed - Cr in normal range and within 360 days    Creat  Date Value Ref Range Status  03/07/2023 0.77 0.60 - 1.00 mg/dL Final         Passed - HGB in normal range and within 360 days    Hemoglobin  Date Value Ref Range Status  03/07/2023 14.0 11.7 - 15.5 g/dL Final         Passed - PLT in normal range and within 360 days    Platelets  Date Value Ref Range Status  03/07/2023 251 140 - 400 Thousand/uL Final         Passed - HCT in normal range and within 360 days    HCT  Date Value Ref Range Status  03/07/2023 43.2 35.0 - 45.0 % Final         Passed - eGFR is 30 or above and within 360 days    GFR, Est African American  Date Value Ref Range Status  05/11/2019 63 > OR = 60 mL/min/1.28m2 Final   GFR, Est Non African American  Date Value Ref Range Status  05/11/2019 54 (L) > OR = 60 mL/min/1.10m2 Final   eGFR  Date Value Ref Range Status  03/07/2023 79 > OR = 60 mL/min/1.27m2 Final         Passed - Patient is not pregnant      Passed - Valid encounter within last 12 months    Recent Outpatient Visits           2 months ago Positive colorectal cancer screening using Cologuard test   [place] [facility] Donita Brooks, MD   6 months ago Essential hypertension   [place] [facility] Pickard, Priscille Heidelberg, MD   7 months ago Neuropathy   [place] [facility] Pickard, Priscille Heidelberg, MD

## 2023-09-03 NOTE — Telephone Encounter (Signed)
 Requested medication (s) are due for refill today: yes   Requested medication (s) are on the active medication list: yes   Last refill:  06/03/23 #180 0 refills  Future visit scheduled: no   Notes to clinic:  no refills remain. Do you want to refill Rx?     Requested Prescriptions  Pending Prescriptions Disp Refills   diclofenac (VOLTAREN) 75 MG EC tablet [Pharmacy Med Name: Diclofenac Sodium 75 MG Oral Tablet Delayed Release] 180 tablet 0    Sig: Take 1 tablet by mouth twice daily as needed     Analgesics:  NSAIDS Failed - 09/03/2023  2:30 PM      Failed - Manual Review: Labs are only required if the patient has taken medication for more than 8 weeks.      Passed - Cr in normal range and within 360 days    Creat  Date Value Ref Range Status  03/07/2023 0.77 0.60 - 1.00 mg/dL Final         Passed - HGB in normal range and within 360 days    Hemoglobin  Date Value Ref Range Status  03/07/2023 14.0 11.7 - 15.5 g/dL Final         Passed - PLT in normal range and within 360 days    Platelets  Date Value Ref Range Status  03/07/2023 251 140 - 400 Thousand/uL Final         Passed - HCT in normal range and within 360 days    HCT  Date Value Ref Range Status  03/07/2023 43.2 35.0 - 45.0 % Final         Passed - eGFR is 30 or above and within 360 days    GFR, Est African American  Date Value Ref Range Status  05/11/2019 63 > OR = 60 mL/min/1.28m2 Final   GFR, Est Non African American  Date Value Ref Range Status  05/11/2019 54 (L) > OR = 60 mL/min/1.54m2 Final   eGFR  Date Value Ref Range Status  03/07/2023 79 > OR = 60 mL/min/1.59m2 Final         Passed - Patient is not pregnant      Passed - Valid encounter within last 12 months    Recent Outpatient Visits           2 months ago Positive colorectal cancer screening using Cologuard test   [place] [facility] Pickard, Priscille Heidelberg, MD   6 months ago Essential hypertension   [place] [facility] Donita Brooks, MD   7 months ago Neuropathy   [place] [facility] Pickard, Priscille Heidelberg, MD              Signed Prescriptions Disp Refills   atorvastatin (LIPITOR) 40 MG tablet 90 tablet 0    Sig: Take 1 tablet by mouth once daily     Cardiovascular:  Antilipid - Statins Failed - 09/03/2023  2:30 PM      Failed - Lipid Panel in normal range within the last 12 months    Cholesterol  Date Value Ref Range Status  03/07/2023 173 <200 mg/dL Final   LDL Cholesterol (Calc)  Date Value Ref Range Status  03/07/2023 98 mg/dL (calc) Final    Comment:    Reference range: <100 . Desirable range <100 mg/dL for primary prevention;   <70 mg/dL for patients with CHD or diabetic patients  with > or = 2 CHD risk factors. Marland Kitchen  LDL-C is now calculated using the Martin-Hopkins  calculation, which is a validated novel method providing  better accuracy than the Friedewald equation in the  estimation of LDL-C.  Horald Pollen et al. Lenox Ahr. 2440;102(72): 2061-2068  (http://education.[facility].com/faq/FAQ164)    HDL  Date Value Ref Range Status  03/07/2023 50 > OR = 50 mg/dL Final   Triglycerides  Date Value Ref Range Status  03/07/2023 155 (H) <150 mg/dL Final         Passed - Patient is not pregnant      Passed - Valid encounter within last 12 months    Recent Outpatient Visits           2 months ago Positive colorectal cancer screening using Cologuard test   [place] [facility] Donita Brooks, MD   6 months ago Essential hypertension   [place] [facility] Tanya Nones, Priscille Heidelberg, MD   7 months ago Neuropathy   [place] [facility] Pickard, Priscille Heidelberg, MD

## 2023-09-16 ENCOUNTER — Ambulatory Visit (INDEPENDENT_AMBULATORY_CARE_PROVIDER_SITE_OTHER): Admitting: Family Medicine

## 2023-09-16 ENCOUNTER — Encounter: Payer: Self-pay | Admitting: Family Medicine

## 2023-09-16 VITALS — BP 124/82 | HR 89 | Temp 98.9°F | Ht 60.0 in | Wt 135.5 lb

## 2023-09-16 DIAGNOSIS — J40 Bronchitis, not specified as acute or chronic: Secondary | ICD-10-CM | POA: Diagnosis not present

## 2023-09-16 MED ORDER — BENZONATATE 100 MG PO CAPS: 100.0000 mg | ORAL_CAPSULE | Freq: Three times a day (TID) | ORAL | 0 refills | Status: DC | PRN

## 2023-09-16 MED ORDER — AZITHROMYCIN 250 MG PO TABS: ORAL_TABLET | ORAL | 0 refills | Status: DC

## 2023-09-16 NOTE — Progress Notes (Signed)
 Patient Office Visit  Assessment & Plan:  Bronchitis -     Azithromycin ; Take 2 tablets (500 mg) PO today, then 1 tablet (250 mg) PO daily x4 days.  Dispense: 6 tablet; Refill: 0 -     Benzonatate ; Take 1 capsule (100 mg total) by mouth 3 (three) times daily as needed for cough.  Dispense: 30 capsule; Refill: 0    Return if symptoms worsen or fail to improve.   Subjective:    Patient ID: Cheryl Skinner, female    DOB: November 15, 1945  Age: 78 y.o. MRN: [id number]  Chief Complaint  Patient presents with   Cough    Cough started a week ago but has now became productive. Pt also has chest congestion.     Cough  Bronchitis- pt has coughing for over one week. Has been having productive cough the past few days.  No shortness of breath or wheezing but her symptoms are worsening.  No fever or chills no previous history of pneumonia or chronic bronchitis.  Patient is a non-smoker.  Husband does smoke at home.  Patient believes he does have a history of allergies.  Patient is allergic to multiple antibiotics and hydrocodone. Patient did take OTC Mucinex  last night and this AM - helped some breaking up the congestion.     The 10-year ASCVD risk score (Arnett DK, et al., 2019) is: 29.3%  Past Medical History:  Diagnosis Date   Hyperlipidemia    Hypertension    Osteopenia    T -2.4 in hip 2021   TMJ (temporomandibular joint syndrome)    Past Surgical History:  Procedure Laterality Date   ABDOMINAL HYSTERECTOMY     OVARIAN CYST REMOVAL     PILONIDAL CYST EXCISION     Social History   Tobacco Use   Smoking status: Former    Types: Cigarettes   Smokeless tobacco: Never  Vaping Use   Vaping status: Never Used  Substance Use Topics   Alcohol use: Yes    Comment: Rare   Drug use: No   Family History  Problem Relation Age of Onset   Colon cancer Neg Hx    Stomach cancer Neg Hx    Esophageal cancer Neg Hx    Allergies  Allergen Reactions   Codeine     REACTION: loses memory    Oxycodone Hcl     REACTION: loses memory   Ampicillin     REACTION: makes sick   Hydrocodone     REACTION: makes sick   Minocycline Hcl     REACTION: makes sick   Nitrofurantoin     REACTION: makes sick   Propoxyphene Hcl     REACTION: makes sick   Propoxyphene N-Acetaminophen     REACTION: makes sick    Review of Systems  Respiratory:  Positive for cough.       Objective:    BP 124/82   Pulse 89   Temp 98.9 F (37.2 C)   Ht 5' (1.524 m)   Wt 135 lb 8 oz (61.5 kg)   SpO2 99%   BMI 26.46 kg/m  BP Readings from Last 3 Encounters:  09/16/23 124/82  06/19/23 136/64  06/10/23 138/88   Wt Readings from Last 3 Encounters:  09/16/23 135 lb 8 oz (61.5 kg)  06/19/23 136 lb (61.7 kg)  06/10/23 136 lb (61.7 kg)    Physical Exam Vitals and nursing note reviewed.  Constitutional:      Appearance: Normal appearance.  HENT:  Head: Normocephalic.     Right Ear: Tympanic membrane, ear canal and external ear normal.     Left Ear: Tympanic membrane, ear canal and external ear normal.     Nose: Congestion present.  Eyes:     Extraocular Movements: Extraocular movements intact.     Pupils: Pupils are equal, round, and reactive to light.  Cardiovascular:     Rate and Rhythm: Normal rate and regular rhythm.     Heart sounds: Normal heart sounds.  Pulmonary:     Effort: Pulmonary effort is normal.     Breath sounds: Normal breath sounds. No wheezing or rhonchi.  Neurological:     General: No focal deficit present.     Mental Status: She is alert.      No results found for any visits on 09/16/23.

## 2023-09-17 ENCOUNTER — Encounter: Payer: Self-pay | Admitting: Family Medicine

## 2023-10-13 ENCOUNTER — Ambulatory Visit: Payer: Self-pay

## 2023-10-13 ENCOUNTER — Other Ambulatory Visit: Payer: Self-pay | Admitting: Family Medicine

## 2023-10-13 DIAGNOSIS — B029 Zoster without complications: Secondary | ICD-10-CM | POA: Diagnosis not present

## 2023-10-13 MED ORDER — VALACYCLOVIR HCL 1 G PO TABS: 1000.0000 mg | ORAL_TABLET | Freq: Three times a day (TID) | ORAL | 0 refills | Status: DC

## 2023-10-13 NOTE — Telephone Encounter (Signed)
 Copied from CRM [id number]. Topic: Clinical - Red Word Triage >> Oct 13, 2023  9:32 AM Kita Perish H wrote: [facility] that prompted transfer to Nurse Triage: Possible shingles forehead above right eye, afraid it's going into her eye, painful   Chief Complaint: Rash Symptoms: Rash to right side of forehead  Frequency: Constant  Pertinent Negatives: Patient denies fever Disposition: [] ED /[] Urgent Care (no appt availability in office) / [x] Appointment(In office/virtual)/ []  Real Virtual Care/ [] Home Care/ [] Refused Recommended Disposition /[] [facility]/ []  Follow-up with PCP Additional Notes: Patient reports that 5 days ago she developed a rash to the right side of her forehead that extends up to her hairline. She states that the rash is itchy and painful when touched. She denies any fevers. Patient reports that she has had similar rashes in the past with shingles outbreaks. Appointment made for Thursday per patient's request. Patient states she will go to urgent care if her symptoms worsen prior to her appointment.     Reason for Disposition  [1] Looks infected (spreading redness, pus) AND [2] no fever    Appointment in 3 days per patient's request  Answer Assessment - Initial Assessment Questions 1. APPEARANCE of RASH: "Describe the rash."      Red rash 2. LOCATION: "Where is the rash located?"      Right forehead up to hairline  3. ONSET: "When did the rash start?"      5 days ago  4. ITCHING: "Does the rash itch?" If Yes, ask: "How bad is the itch?"  (Scale 1-10; or mild, moderate, severe)     Moderate  5. PAIN: "Does the rash hurt?" If Yes, ask: "How bad is the pain?"  (Scale 0-10; or none, mild, moderate, severe)    - NONE (0): no pain    - MILD (1-3): doesn't interfere with normal activities     - MODERATE (4-7): interferes with normal activities or awakens from sleep     - SEVERE (8-10): excruciating pain, unable to do any normal activities     Moderate pain only when  touching rash 6. OTHER SYMPTOMS: "Do you have any other symptoms?" (e.g., fever)     No  Protocols used: Shingles (Zoster)-A-AH

## 2023-10-16 ENCOUNTER — Ambulatory Visit (INDEPENDENT_AMBULATORY_CARE_PROVIDER_SITE_OTHER): Admitting: Family Medicine

## 2023-10-16 VITALS — BP 120/76 | HR 76 | Temp 97.8°F | Ht 60.0 in | Wt 137.8 lb

## 2023-10-16 DIAGNOSIS — B029 Zoster without complications: Secondary | ICD-10-CM | POA: Diagnosis not present

## 2023-10-16 MED ORDER — TRIAMCINOLONE ACETONIDE 0.1 % EX CREA: 1.0000 | TOPICAL_CREAM | Freq: Two times a day (BID) | CUTANEOUS | 0 refills | Status: DC

## 2023-10-16 NOTE — Progress Notes (Signed)
 Subjective:    Patient ID: Cheryl Skinner, female    DOB: [birth year], 78 y.o.   MRN: [id number]  Patient has an itching red macular plaque-like rash starts in the center of her back at the level of L1 and radiates around the L1 dermatome to just above her pubic bone and then abruptly stops on the right side.  It itches and burns.  She also has a herpes like rash right above her right eye into her right forehead.  She is being treated with Valtrex  for shingles. Past Medical History:  Diagnosis Date   Hyperlipidemia    Hypertension    Osteopenia    T -2.4 in hip 2021   TMJ (temporomandibular joint syndrome)    Past Surgical History:  Procedure Laterality Date   ABDOMINAL HYSTERECTOMY     OVARIAN CYST REMOVAL     PILONIDAL CYST EXCISION     Current Outpatient Medications on File Prior to Visit  Medication Sig Dispense Refill   atorvastatin  (LIPITOR) 40 MG tablet Take 1 tablet by mouth once daily 90 tablet 0   diclofenac  (VOLTAREN ) 75 MG EC tablet Take 1 tablet by mouth twice daily as needed 180 tablet 0   losartan -hydrochlorothiazide  (HYZAAR) 50-12.5 MG tablet Take 1 tablet by mouth daily. 90 tablet 1   meclizine  (ANTIVERT ) 25 MG tablet Take 1 tablet (25 mg total) by mouth 3 (three) times daily as needed for dizziness. 30 tablet 1   valACYclovir  (VALTREX ) 1000 MG tablet Take 1 tablet (1,000 mg total) by mouth 3 (three) times daily. 21 tablet 0   azithromycin  (ZITHROMAX  Z-PAK) 250 MG tablet Take 2 tablets (500 mg) PO today, then 1 tablet (250 mg) PO daily x4 days. (Patient not taking: Reported on 10/16/2023) 6 tablet 0   benzonatate  (TESSALON  PERLES) 100 MG capsule Take 1 capsule (100 mg total) by mouth 3 (three) times daily as needed for cough. (Patient not taking: Reported on 10/16/2023) 30 capsule 0   No current facility-administered medications on file prior to visit.   Allergies  Allergen Reactions   Codeine     REACTION: loses memory   Oxycodone Hcl     REACTION: loses memory    Ampicillin     REACTION: makes sick   Hydrocodone     REACTION: makes sick   Minocycline Hcl     REACTION: makes sick   Nitrofurantoin     REACTION: makes sick   Propoxyphene Hcl     REACTION: makes sick   Propoxyphene N-Acetaminophen     REACTION: makes sick   Social History   Socioeconomic History   Marital status: Married    Spouse name: Not on file   Number of children: Not on file   Years of education: Not on file   Highest education level: Not on file  Occupational History   Not on file  Tobacco Use   Smoking status: Former    Types: Cigarettes   Smokeless tobacco: Never  Vaping Use   Vaping status: Never Used  Substance and Sexual Activity   Alcohol use: Yes    Comment: Rare   Drug use: No   Sexual activity: Not on file  Other Topics Concern   Not on file  Social History Narrative   Not on file   Social Drivers of Health   Financial Resource Strain: Not on file  Food Insecurity: Not on file  Transportation Needs: Not on file  Physical Activity: Not on file  Stress: Not on  file  Social Connections: Not on file  Intimate Partner Violence: Not on file     Review of Systems  Skin:  Positive for rash.  All other systems reviewed and are negative.      Objective:   Physical Exam Constitutional:      General: She is not in acute distress.    Appearance: Normal appearance. She is normal weight. She is not ill-appearing or toxic-appearing.  HENT:     Head:   Cardiovascular:     Rate and Rhythm: Normal rate. Rhythm irregular.     Pulses: Normal pulses.          Dorsalis pedis pulses are 2+ on the right side and 2+ on the left side.       Posterior tibial pulses are 2+ on the right side and 2+ on the left side.     Heart sounds: Normal heart sounds. No murmur heard.    No gallop.  Pulmonary:     Effort: Pulmonary effort is normal. No respiratory distress.     Breath sounds: Normal breath sounds. No stridor. No wheezing, rhonchi or rales.   Abdominal:     General: Abdomen is flat. Bowel sounds are normal. There is no distension.     Palpations: Abdomen is soft.     Tenderness: There is no abdominal tenderness. There is no guarding.  Musculoskeletal:     Right lower leg: No edema.     Left lower leg: No edema.       Legs:  Feet:     Right foot:     Skin integrity: Skin integrity normal.     Left foot:     Skin integrity: Skin integrity normal.  Skin:    Findings: Erythema and rash present. Rash is macular, papular and vesicular.  Neurological:     Mental Status: She is alert.           Assessment & Plan:  Herpes zoster without complication Finish Valtrex  1000 mg 3 times daily for 7 days.  If itching worsens we can use gabapentin 300 mg tid.

## 2023-10-24 ENCOUNTER — Ambulatory Visit
Admission: RE | Admit: 2023-10-24 | Discharge: 2023-10-24 | Disposition: A | Discharge status: Home / Self Care | Payer: Medicare Other | Source: Ambulatory Visit | Attending: Family Medicine | Admitting: Family Medicine

## 2023-10-24 DIAGNOSIS — M8580 Other specified disorders of bone density and structure, unspecified site: Secondary | ICD-10-CM

## 2023-10-24 DIAGNOSIS — M8588 Other specified disorders of bone density and structure, other site: Secondary | ICD-10-CM | POA: Diagnosis not present

## 2023-10-28 ENCOUNTER — Ambulatory Visit: Payer: Self-pay | Admitting: Family Medicine

## 2023-12-08 ENCOUNTER — Other Ambulatory Visit: Payer: Self-pay | Admitting: Family Medicine

## 2023-12-08 DIAGNOSIS — E7800 Pure hypercholesterolemia, unspecified: Secondary | ICD-10-CM

## 2023-12-17 ENCOUNTER — Other Ambulatory Visit: Payer: Self-pay | Admitting: Family Medicine

## 2024-01-07 ENCOUNTER — Telehealth: Payer: Self-pay

## 2024-01-07 NOTE — Telephone Encounter (Signed)
 Copied from CRM #[id number]. Topic: Appointments - Appointment Scheduling >> Jan 07, 2024 12:10 PM Montie POUR wrote: Patient/patient representative is calling to schedule an appointment. Refer to attachments for appointment information.  Cheryl Skinner would like to come in to have a nurse check her blood pressure machine to make sure it is reading correct. Please call her at [phone] to discuss. >> Jan 07, 2024 12:14 PM Montie POUR wrote: There was no appointment available until August 22.

## 2024-01-09 ENCOUNTER — Ambulatory Visit (INDEPENDENT_AMBULATORY_CARE_PROVIDER_SITE_OTHER): Admitting: Family Medicine

## 2024-01-09 VITALS — BP 158/77 | HR 79 | Ht 60.0 in

## 2024-01-09 DIAGNOSIS — I10 Essential (primary) hypertension: Secondary | ICD-10-CM

## 2024-01-09 NOTE — Patient Instructions (Addendum)
 Per Dr. Clara review of B/P readings today his recommendations are for pt. To start taking current medication of   losartan -hydrochlorothiazide  (HYZAAR) 50-12.5 MG tablet  Two times daily and come back to office in two weeks for recheck of B/P . He states that pressure readings are to elevated and he needs them to be lower.

## 2024-01-09 NOTE — Progress Notes (Signed)
 Patient ID: Cheryl Skinner, female   DOB: June 15, 1945, 78 y.o.   MRN: [id number] Per Dr. Clara review of B/P readings today his recommendations are for pt. To start taking current medication of   losartan -hydrochlorothiazide  (HYZAAR) 50-12.5 MG tablet  Two times daily ,and come back to office in two weeks for recheck of B/P . He states that pressure readings are to elevated and he needs them to be lower.  Gave instructions to pt. Insisted that if pt. Began to have chest pain, felt dizzy, out of breath, or any similar symptoms to visit the ED. She stated she understood and would see us  in two weeks. Left instructions for front office to schedule pt. For visit in two weeks.

## 2024-01-13 ENCOUNTER — Other Ambulatory Visit: Payer: Self-pay | Admitting: Family Medicine

## 2024-01-13 DIAGNOSIS — I10 Essential (primary) hypertension: Secondary | ICD-10-CM

## 2024-01-14 NOTE — Telephone Encounter (Signed)
 Prescription Request  01/14/2024  LOV: 01/09/2024  What is the name of the medication or equipment?   losartan -hydrochlorothiazide  (HYZAAR) 50-12.5 MG tablet  **90 day script requested**  Have you contacted your pharmacy to request a refill? Yes   Which pharmacy would you like this sent to?  [facility] [address], [place] - [address] [place] [address] Phone: [phone] Fax: [phone]    Patient notified that their request is being sent to the clinical staff for review and that they should receive a response within 2 business days.   Please advise pharmacist.

## 2024-01-15 NOTE — Telephone Encounter (Signed)
 Requested Prescriptions  Pending Prescriptions Disp Refills   losartan -hydrochlorothiazide  (HYZAAR) 50-12.5 MG tablet [Pharmacy Med Name: Losartan  Potassium-HCTZ 50-12.5 MG Oral Tablet] 90 tablet 0    Sig: Take 1 tablet by mouth once daily     Cardiovascular: ARB + Diuretic Combos Failed - 01/15/2024 12:15 PM      Failed - K in normal range and within 180 days    Potassium  Date Value Ref Range Status  03/07/2023 4.1 3.5 - 5.3 mmol/L Final         Failed - Na in normal range and within 180 days    Sodium  Date Value Ref Range Status  03/07/2023 141 135 - 146 mmol/L Final         Failed - Cr in normal range and within 180 days    Creat  Date Value Ref Range Status  03/07/2023 0.77 0.60 - 1.00 mg/dL Final         Failed - eGFR is 10 or above and within 180 days    GFR, Est African American  Date Value Ref Range Status  05/11/2019 63 > OR = 60 mL/min/1.7m2 Final   GFR, Est Non African American  Date Value Ref Range Status  05/11/2019 54 (L) > OR = 60 mL/min/1.47m2 Final   eGFR  Date Value Ref Range Status  03/07/2023 79 > OR = 60 mL/min/1.13m2 Final         Failed - Last BP in normal range    BP Readings from Last 1 Encounters:  01/09/24 (!) 158/77         Failed - Valid encounter within last 6 months    Recent Outpatient Visits           6 days ago Essential hypertension   [place] [facility] Duanne Butler DASEN, MD   3 months ago Herpes zoster without complication   [place] [facility] Duanne Butler DASEN, MD   4 months ago Bronchitis   [place] [facility] Aletha Bene, MD   7 months ago Positive colorectal cancer screening using Cologuard test   [place] [facility] Duanne Butler DASEN, MD   10 months ago Essential hypertension   [place] [facility] Pickard, Butler DASEN, MD              Passed - Patient is not pregnant

## 2024-01-21 NOTE — Progress Notes (Signed)
   01/21/2024  Patient ID: Cheryl Skinner, female   DOB: 1945/07/14, 78 y.o.   MRN: [id number]  Pharmacy Quality Measure Review  This patient is appearing on a report for being at risk of failing the adherence measure for hypertension (ACEi/ARB) medications this calendar year.   Medication: losartan -hctz Last fill date: 01/15/2024 for 90 day supply  Insurance report was not up to date. No action needed at this time.   Lang Sieve, PharmD, BCGP Clinical Pharmacist  [phone]

## 2024-01-22 ENCOUNTER — Ambulatory Visit

## 2024-01-22 VITALS — BP 142/68 | HR 72

## 2024-01-22 DIAGNOSIS — Z85828 Personal history of other malignant neoplasm of skin: Secondary | ICD-10-CM | POA: Insufficient documentation

## 2024-01-22 DIAGNOSIS — I10 Essential (primary) hypertension: Secondary | ICD-10-CM

## 2024-01-22 DIAGNOSIS — D225 Melanocytic nevi of trunk: Secondary | ICD-10-CM | POA: Insufficient documentation

## 2024-01-22 DIAGNOSIS — L821 Other seborrheic keratosis: Secondary | ICD-10-CM | POA: Insufficient documentation

## 2024-01-22 NOTE — Progress Notes (Unsigned)
 Patient is in office today for a nurse visit for Blood Pressure Check. Patient blood pressure was 156/76, Patient No cardiac sx.

## 2024-02-09 ENCOUNTER — Telehealth: Payer: Self-pay

## 2024-02-09 NOTE — Telephone Encounter (Signed)
 LM for pt to call office back to discuss. Mjp,lpn   Copied from CRM #[id number]. Topic: General - Other >> Feb 09, 2024  3:24 PM Ivette P wrote: Reason for CRM: PT called in and requested Dr. Duanne nurse to reach out. Pt would not disclose just wanted to get a call from Nurse.

## 2024-02-10 ENCOUNTER — Ambulatory Visit: Admitting: Family Medicine

## 2024-02-10 ENCOUNTER — Encounter: Payer: Self-pay | Admitting: Family Medicine

## 2024-02-10 VITALS — BP 118/54 | HR 82 | Temp 98.2°F | Ht 60.0 in | Wt 137.0 lb

## 2024-02-10 DIAGNOSIS — K921 Melena: Secondary | ICD-10-CM

## 2024-02-10 DIAGNOSIS — Z1329 Encounter for screening for other suspected endocrine disorder: Secondary | ICD-10-CM | POA: Diagnosis not present

## 2024-02-10 LAB — HEMOGLOBIN, FINGERSTICK: POC HEMOGLOBIN: 10.8 g/dL — ABNORMAL LOW (ref 12.0–15.0)

## 2024-02-10 MED ORDER — PANTOPRAZOLE SODIUM 40 MG PO TBEC: 40.0000 mg | DELAYED_RELEASE_TABLET | Freq: Two times a day (BID) | ORAL | 3 refills | Status: AC

## 2024-02-10 NOTE — Progress Notes (Signed)
 Subjective:    Patient ID: Cheryl Skinner, female    DOB: 1946/05/23, 78 y.o.   MRN: [id number]  Patient's last hemoglobin was 14 but that was in 2024.  Patient states that she is experiencing black stool over the last 3 days.  She also reports feeling tired.  She also reports burning and pressure and gas-like feeling in the epigastric area.  She denies any nausea or vomiting or hematochezia.  She denies any chest pain shortness of breath or lightheadedness.  I performed a rectal exam today with my nurse present.  This was positive for blood.  The stool was slightly greenish and black in color.  There was no bright red blood.  Fingerstick hemoglobin was 10.8. Past Medical History:  Diagnosis Date   Hyperlipidemia    Hypertension    Osteopenia    T -2.4 in hip 2021   TMJ (temporomandibular joint syndrome)    Past Surgical History:  Procedure Laterality Date   ABDOMINAL HYSTERECTOMY     OVARIAN CYST REMOVAL     PILONIDAL CYST EXCISION     Current Outpatient Medications on File Prior to Visit  Medication Sig Dispense Refill   atorvastatin  (LIPITOR) 40 MG tablet Take 1 tablet by mouth once daily 90 tablet 0   diclofenac  (VOLTAREN ) 75 MG EC tablet Take 1 tablet by mouth twice daily as needed 180 tablet 0   losartan -hydrochlorothiazide  (HYZAAR) 50-12.5 MG tablet Take 1 tablet by mouth once daily (Patient taking differently: Take 2 tablets by mouth daily.) 90 tablet 0   meclizine  (ANTIVERT ) 25 MG tablet Take 1 tablet (25 mg total) by mouth 3 (three) times daily as needed for dizziness. 30 tablet 1   triamcinolone  cream (KENALOG ) 0.1 % Apply 1 Application topically 2 (two) times daily. 30 g 0   azithromycin  (ZITHROMAX  Z-PAK) 250 MG tablet Take 2 tablets (500 mg) PO today, then 1 tablet (250 mg) PO daily x4 days. (Patient not taking: Reported on 10/16/2023) 6 tablet 0   benzonatate  (TESSALON  PERLES) 100 MG capsule Take 1 capsule (100 mg total) by mouth 3 (three) times daily as needed for cough.  (Patient not taking: Reported on 10/16/2023) 30 capsule 0   valACYclovir  (VALTREX ) 1000 MG tablet Take 1 tablet (1,000 mg total) by mouth 3 (three) times daily. (Patient not taking: Reported on 02/10/2024) 21 tablet 0   No current facility-administered medications on file prior to visit.   Allergies  Allergen Reactions   Codeine     REACTION: loses memory   Oxycodone Hcl     REACTION: loses memory   Ampicillin     REACTION: makes sick   Hydrocodone     REACTION: makes sick   Minocycline Hcl     REACTION: makes sick   Nitrofurantoin     REACTION: makes sick   Propoxyphene Hcl     REACTION: makes sick   Propoxyphene N-Acetaminophen     REACTION: makes sick   Social History   Socioeconomic History   Marital status: Married    Spouse name: Not on file   Number of children: Not on file   Years of education: Not on file   Highest education level: Not on file  Occupational History   Not on file  Tobacco Use   Smoking status: Former    Types: Cigarettes   Smokeless tobacco: Never  Vaping Use   Vaping status: Never Used  Substance and Sexual Activity   Alcohol use: Yes    Comment: Rare  Drug use: No   Sexual activity: Not on file  Other Topics Concern   Not on file  Social History Narrative   Not on file   Social Drivers of Health   Financial Resource Strain: Not on file  Food Insecurity: Not on file  Transportation Needs: Not on file  Physical Activity: Not on file  Stress: Not on file  Social Connections: Not on file  Intimate Partner Violence: Not on file     Review of Systems  Skin:  Positive for rash.  All other systems reviewed and are negative.      Objective:   Physical Exam Constitutional:      General: She is not in acute distress.    Appearance: Normal appearance. She is normal weight. She is not ill-appearing or toxic-appearing.  HENT:     Head:   Cardiovascular:     Rate and Rhythm: Normal rate and regular rhythm.     Pulses: Normal  pulses.          Dorsalis pedis pulses are 2+ on the right side and 2+ on the left side.       Posterior tibial pulses are 2+ on the right side and 2+ on the left side.     Heart sounds: Normal heart sounds. No murmur heard.    No gallop.  Pulmonary:     Effort: Pulmonary effort is normal. No respiratory distress.     Breath sounds: Normal breath sounds. No stridor. No wheezing, rhonchi or rales.  Abdominal:     General: Abdomen is flat. Bowel sounds are normal. There is no distension.     Palpations: Abdomen is soft.     Tenderness: There is no abdominal tenderness. There is no guarding.  Musculoskeletal:     Right lower leg: No edema.     Left lower leg: No edema.  Feet:     Right foot:     Skin integrity: Skin integrity normal.     Left foot:     Skin integrity: Skin integrity normal.  Neurological:     Mental Status: She is alert.           Assessment & Plan:  Melena - Plan: Hemoglobin, fingerstick, pantoprazole  (PROTONIX ) 40 MG tablet, CBC with Differential/Platelet, Comprehensive metabolic panel with GFR, Vitamin B12, Iron, TSH Patient has stool positive for blood as well as a drop in her hemoglobin from 14-10.8.  I believe her fatigue is due to an upper GI bleed based on her symptoms most likely due to a bleeding ulcer.  Recommended discontinuation of aspirin and diclofenac  which she has been taking.  Begin pantoprazole  40 mg twice daily immediately.  Temporarily hold her blood pressure medication to avoid hypotension.  Check CBC CMP B12 iron TSH.  Consult GI as soon as possible for EGD.  If she becomes weaker or lightheaded or short of breath or copious melena develops, she should go to the emergency room immediately.  I explained this to her and she understands.  Patient will come back on Friday so I can recheck her blood pressure and blood counts prior to the weekend.  If her hemoglobin is stable at that point, we will likely start an iron supplement.  However I do not want  to start iron today as it may make it harder for the patient to determine if her bleeding is worsening indicating that she would need to go to the emergency room.

## 2024-02-11 LAB — CBC WITH DIFFERENTIAL/PLATELET
Absolute Lymphocytes: 2245 {cells}/uL (ref 850–3900)
Absolute Monocytes: 679 {cells}/uL (ref 200–950)
Basophils Absolute: 60 {cells}/uL (ref 0–200)
Basophils Relative: 0.7 %
Eosinophils Absolute: 335 {cells}/uL (ref 15–500)
Eosinophils Relative: 3.9 %
HCT: 33.7 % — ABNORMAL LOW (ref 35.0–45.0)
Hemoglobin: 11.3 g/dL — ABNORMAL LOW (ref 11.7–15.5)
MCH: 31.1 pg (ref 27.0–33.0)
MCHC: 33.5 g/dL (ref 32.0–36.0)
MCV: 92.8 fL (ref 80.0–100.0)
MPV: 12.3 fL (ref 7.5–12.5)
Monocytes Relative: 7.9 %
Neutro Abs: 5280 {cells}/uL (ref 1500–7800)
Neutrophils Relative %: 61.4 %
Platelets: 253 Thousand/uL (ref 140–400)
RBC: 3.63 Million/uL — ABNORMAL LOW (ref 3.80–5.10)
RDW: 12.1 % (ref 11.0–15.0)
Total Lymphocyte: 26.1 %
WBC: 8.6 Thousand/uL (ref 3.8–10.8)

## 2024-02-11 LAB — COMPREHENSIVE METABOLIC PANEL WITH GFR
AG Ratio: 2 (calc) (ref 1.0–2.5)
ALT: 16 U/L (ref 6–29)
AST: 19 U/L (ref 10–35)
Albumin: 4.1 g/dL (ref 3.6–5.1)
Alkaline phosphatase (APISO): 52 U/L (ref 37–153)
BUN: 19 mg/dL (ref 7–25)
CO2: 29 mmol/L (ref 20–32)
Calcium: 9.6 mg/dL (ref 8.6–10.4)
Chloride: 104 mmol/L (ref 98–110)
Creat: 0.74 mg/dL (ref 0.60–1.00)
Globulin: 2.1 g/dL (ref 1.9–3.7)
Glucose, Bld: 95 mg/dL (ref 65–99)
Potassium: 4 mmol/L (ref 3.5–5.3)
Sodium: 141 mmol/L (ref 135–146)
Total Bilirubin: 0.5 mg/dL (ref 0.2–1.2)
Total Protein: 6.2 g/dL (ref 6.1–8.1)
eGFR: 82 mL/min/1.73m2 (ref >=60)

## 2024-02-11 LAB — TSH: TSH: 2.64 m[IU]/L (ref 0.40–4.50)

## 2024-02-11 LAB — IRON: Iron: 58 ug/dL (ref 45–160)

## 2024-02-11 LAB — VITAMIN B12: Vitamin B-12: 537 pg/mL (ref 200–1100)

## 2024-02-12 ENCOUNTER — Telehealth: Payer: Self-pay

## 2024-02-12 ENCOUNTER — Ambulatory Visit: Payer: Self-pay | Admitting: Family Medicine

## 2024-02-12 NOTE — Telephone Encounter (Signed)
 Copied from CRM [id number]. Topic: Clinical - Medical Advice >> Feb 12, 2024  2:01 PM Carlatta H wrote: Reason for CRM: the patient would like a call from Nurse Ronal Bradley about blood pressure//109/[phone]

## 2024-02-13 ENCOUNTER — Other Ambulatory Visit

## 2024-02-13 DIAGNOSIS — K921 Melena: Secondary | ICD-10-CM

## 2024-02-13 DIAGNOSIS — D649 Anemia, unspecified: Secondary | ICD-10-CM

## 2024-02-13 LAB — HEMOGLOBIN, FINGERSTICK: POC HEMOGLOBIN: 12 g/dL (ref 12.0–15.0)

## 2024-02-23 ENCOUNTER — Other Ambulatory Visit

## 2024-02-23 ENCOUNTER — Ambulatory Visit: Admitting: Gastroenterology

## 2024-02-23 ENCOUNTER — Encounter: Payer: Self-pay | Admitting: Gastroenterology

## 2024-02-23 VITALS — BP 156/84 | HR 129 | Ht 60.0 in | Wt 140.0 lb

## 2024-02-23 DIAGNOSIS — D649 Anemia, unspecified: Secondary | ICD-10-CM

## 2024-02-23 DIAGNOSIS — K921 Melena: Secondary | ICD-10-CM | POA: Diagnosis not present

## 2024-02-23 LAB — HEMOGLOBIN: Hemoglobin: 11.9 g/dL — ABNORMAL LOW (ref 12.0–15.0)

## 2024-02-23 LAB — HEMATOCRIT: HCT: 35.7 % — ABNORMAL LOW (ref 36.0–46.0)

## 2024-02-23 NOTE — Patient Instructions (Addendum)
  Continue Pantoprazole  40 mg po daily for 1 month recheck H/H now, again in 1 month, if blood level continues to drop will need to schedule endoscopy No NSAID's (baby Aspirin, diclofenac ) Advised to go to the ER if there is any severe weakness, severe abdominal pain, vomit blood, dark red blood in your bowel movement, shortness of breath or chest pain.   Your provider has requested that you go to the basement level for lab work before leaving today. Press B on the elevator. The lab is located at the first door on the left as you exit the elevator.  _______________________________________________________  If your blood pressure at your visit was 140/90 or greater, please contact your primary care physician to follow up on this.  _______________________________________________________  If you are age 3 or older, your body mass index should be between 23-30. Your Body mass index is 27.34 kg/m. If this is out of the aforementioned range listed, please consider follow up with your Primary Care Provider.  If you are age 54 or younger, your body mass index should be between 19-25. Your Body mass index is 27.34 kg/m. If this is out of the aformentioned range listed, please consider follow up with your Primary Care Provider.   ________________________________________________________  The [facility] providers would like to encourage you to use MYCHART to communicate with providers for non-urgent requests or questions.  Due to long hold times on the telephone, sending your provider a message by [facility] may be a faster and more efficient way to get a response.  Please allow 48 business hours for a response.  Please remember that this is for non-urgent requests.  _______________________________________________________  [facility] is using a team-based approach to care.  Your team is made up of your doctor and two to three APPS. Our APPS (Nurse Practitioners and Physician Assistants) work  with your physician to ensure care continuity for you. They are fully qualified to address your health concerns and develop a treatment plan. They communicate directly with your gastroenterologist to care for you. Seeing the Advanced Practice Practitioners on your physician's team can help you by facilitating care more promptly, often allowing for earlier appointments, access to diagnostic testing, procedures, and other specialty referrals.   Thank you for trusting me with your gastrointestinal care. Deanna May, FNP-C

## 2024-02-23 NOTE — Progress Notes (Addendum)
 Chief Complaint: follow-up colonoscopy Primary GI Doctor: (previously Dr. Obie) Dr. Suzann  HPI: Cheryl Skinner is a 78 year old female Cheryl Skinner with past medical history of hyperlipidemia, hypertension, and osteopenia, who was referred to me by Cheryl Butler DASEN, MD on 02/10/24 for melena.  Interval History    Cheryl Skinner presents for evaluation of melena and anemia.  Cheryl Skinner reports she went to see her PCP due to noting black tarry stools.  Cheryl Skinner states that the black tarry stools lasted for about 5 days.  Cheryl Skinner was also feeling sluggish and experiencing burning pain in her abdomen.  She tells me that at the time her losartan  had been increased due to having issues controlling her blood pressure.  Cheryl Skinner states shortly after that she did not feel well for about 3 weeks.  She reported this to her PCP and it was discontinued.  Cheryl Skinner was placed on pantoprazole  40 mg p.o. daily.  They also stopped her daily baby aspirin and diclofenac  she takes for TMJ.  Cheryl Skinner states after these changes her symptoms completely resolved.   Wt Readings from Last 3 Encounters:  02/23/24 140 lb (63.5 kg)  02/10/24 137 lb (62.1 kg)  10/16/23 137 lb 12.8 oz (62.5 kg)   Past Medical History:  Diagnosis Date   Hyperlipidemia    Hypertension    Osteopenia    T -2.4 in hip 2021   TMJ (temporomandibular joint syndrome)    Past Surgical History:  Procedure Laterality Date   ABDOMINAL HYSTERECTOMY     OVARIAN CYST REMOVAL     PILONIDAL CYST EXCISION     Current Outpatient Medications  Medication Sig Dispense Refill   atorvastatin  (LIPITOR) 40 MG tablet Take 1 tablet by mouth once daily 90 tablet 0   meclizine  (ANTIVERT ) 25 MG tablet Take 1 tablet (25 mg total) by mouth 3 (three) times daily as needed for dizziness. 30 tablet 1   pantoprazole  (PROTONIX ) 40 MG tablet Take 1 tablet (40 mg total) by mouth 2 (two) times daily. 60 tablet 3   No current facility-administered medications for this visit.   Allergies  as of 02/23/2024 - Review Complete 02/23/2024  Allergen Reaction Noted   Codeine  02/01/2010   Oxycodone hcl  02/01/2010   Ampicillin  02/01/2010   Hydrocodone  02/01/2010   Minocycline hcl  02/01/2010   Nitrofurantoin  02/01/2010   Propoxyphene hcl  02/01/2010   Propoxyphene n-acetaminophen  02/01/2010   Family History  Problem Relation Age of Onset   Colon cancer Neg Hx    Stomach cancer Neg Hx    Esophageal cancer Neg Hx    Review of Systems:    Constitutional: No weight loss, fever, chills, weakness or fatigue HEENT: Eyes: No change in vision               Ears, Nose, Throat:  No change in hearing or congestion Skin: No rash or itching Cardiovascular: No chest pain, chest pressure or palpitations   Respiratory: No SOB or cough Gastrointestinal: See HPI and otherwise negative Genitourinary: No dysuria or change in urinary frequency Neurological: No headache, dizziness or syncope Musculoskeletal: No new muscle or joint pain Hematologic: No bleeding or bruising Psychiatric: No history of depression or anxiety   Physical Exam:  Vital signs: BP (!) 156/84   Pulse (!) 129   Ht 5' (1.524 m)   Wt 140 lb (63.5 kg)   BMI 27.34 kg/m   Constitutional: Pleasant Caucasian female appears to be in NAD, Well developed, Well  nourished, alert and cooperative Throat: Oral cavity and pharynx without inflammation, swelling or lesion.  Respiratory: Respirations even and unlabored. Lungs clear to auscultation bilaterally.   No wheezes, crackles, or rhonchi.  Cardiovascular: Normal S1, S2. Regular rate and rhythm. No peripheral edema, cyanosis or pallor.  Gastrointestinal:  Soft, nondistended, nontender. No rebound or guarding. Normal bowel sounds. No appreciable masses or hepatomegaly. Rectal:  Not performed.  Skin:   Dry and intact without significant lesions or rashes. Psychiatric: Oriented to person, place and time. Demonstrates good judgement and reason without abnormal affect or  behaviors.  RELEVANT LABS AND IMAGING: CBC    Latest Ref Rng & Units 02/10/2024   11:41 AM 03/07/2023   10:04 AM 08/24/2021    8:28 AM  CBC  WBC 3.8 - 10.8 Thousand/uL 8.6  5.8  6.0   Hemoglobin 11.7 - 15.5 g/dL 88.6  85.9  86.7   Hematocrit 35.0 - 45.0 % 33.7  43.2  39.9   Platelets 140 - 400 Thousand/uL 253  251  204     CMP     Latest Ref Rng & Units 02/10/2024   11:41 AM 03/07/2023   10:04 AM 08/24/2021    8:28 AM  CMP  Glucose 65 - 99 mg/dL 95  89  95   BUN 7 - 25 mg/dL 19  18  23    Creatinine 0.60 - 1.00 mg/dL 9.25  9.22  9.08   Sodium 135 - 146 mmol/L 141  141  140   Potassium 3.5 - 5.3 mmol/L 4.0  4.1  4.2   Chloride 98 - 110 mmol/L 104  103  105   CO2 20 - 32 mmol/L 29  29  26    Calcium  8.6 - 10.4 mg/dL 9.6  9.8  9.3   Total Protein 6.1 - 8.1 g/dL 6.2  6.5  6.3   Total Bilirubin 0.2 - 1.2 mg/dL 0.5  0.6  0.4   AST 10 - 35 U/L 19  18  21    ALT 6 - 29 U/L 16  19  27      Lab Results  Component Value Date   TSH 2.64 02/10/2024  03/26/23 Cologuard positive 02/10/24 Iron 58, b 12  537,   02/15/10 colonoscopy with Dr. Obie No polyps or cancers. Normal colonoscopy.  06/19/2023 colonoscopy - Diverticulosis in the sigmoid colon. - The entire examined colon is normal. - Internal hemorrhoids. - No specimens collected.  Assessment: Encounter Diagnoses  Name Primary?   Melena Yes   Anemia, unspecified type      78 year old female Cheryl Skinner that presents for evaluation of melena and mild anemia discovered on recent lab work with her PCP.  Cheryl Skinner states at that time her losartan  dose had been increased and she overall did not feel well. SE can include dyspepsia. Cheryl Skinner reports this medication was discontinued and she was placed on pantoprazole  40 mg p.o. daily.  Cheryl Skinner was also taking a daily baby aspirin and diclofenac  for TMJ which was both discontinued.  Cheryl Skinner reports all her symptoms have improved and she is feeling much better.  Hemoglobin originally was 11.3 which  dropped from 14 in October 2024.  It was then checked via fingerstick on 9/16 and 10.8.  Checked again 3 days later and at 12.  We discussed pursuing upper GI endoscopy to rule out peptic ulcer disease and/or gastritis and evaluate source of bleeding.  Cheryl Skinner would like to hold off for now. Recent colonoscopy normal.  She agrees to have her levels  rechecked now and again in a few weeks.  Educated Cheryl Skinner if the dark stools were to return she would have to call our office immediately and set up endoscopy. Educated on avoiding NSAID's. Pt verbalizes understanding.  Plan: -Continue Pantoprazole  40 mg po daily for another 4 weeks -recheck H/H now, and again in 1 month - No NSAIDs . - Recommended EGD, Cheryl Skinner would like to hold off for now. -Advised to go to the ER if there is any severe weakness, severe abdominal pain, vomit blood, dark red blood in your bowel movement, shortness of breath or chest pain.    Thank you for the courtesy of this consult. Please call me with any questions or concerns.   Cheryl Moffet, FNP-C [facility] 02/23/2024, 4:09 PM  Cc: Cheryl Butler DASEN, MD  I have reviewed the clinic note as outlined by Cheryl Beal, NP and agree with the assessment, plan and medical decision making.  Cheryl Skinner is referred to the office for melena and anemia.  Has a reported history of taking baby aspirin and diclofenac  for musculoskeletal pain.  Reported dark stools to her PCP.  Hemoglobin 2 weeks ago was 11.3 and 11.9 on recheck today.  She has ceased NSAIDs.  Previous baseline hemoglobin ranged 12-14.  No nausea, vomiting or abdominal pain.  Agree with continuing PPI.  APP note indicates that Cheryl Skinner declined EGD.  In that setting would recommend careful monitoring of repeat H&H-checking in 1 month is reasonable if there is no ongoing bleeding.  If Cheryl Skinner manifests recurrent melena then would recommend rechecking hemoglobin at a sooner date and more serious consideration of  EGD.  Cheryl Hausen, MD

## 2024-02-24 ENCOUNTER — Ambulatory Visit: Payer: Self-pay | Admitting: Gastroenterology

## 2024-03-08 ENCOUNTER — Other Ambulatory Visit: Payer: Self-pay | Admitting: Family Medicine

## 2024-03-08 DIAGNOSIS — E7800 Pure hypercholesterolemia, unspecified: Secondary | ICD-10-CM

## 2024-03-11 ENCOUNTER — Ambulatory Visit

## 2024-03-12 ENCOUNTER — Encounter: Payer: Self-pay | Admitting: Family Medicine

## 2024-03-12 ENCOUNTER — Ambulatory Visit: Admitting: Family Medicine

## 2024-03-12 VITALS — BP 145/82 | HR 78 | Temp 98.9°F | Ht 60.0 in | Wt 138.6 lb

## 2024-03-12 DIAGNOSIS — I10 Essential (primary) hypertension: Secondary | ICD-10-CM

## 2024-03-12 DIAGNOSIS — E7800 Pure hypercholesterolemia, unspecified: Secondary | ICD-10-CM

## 2024-03-12 MED ORDER — HYDROCHLOROTHIAZIDE 25 MG PO TABS: 25.0000 mg | ORAL_TABLET | Freq: Every day | ORAL | 3 refills | Status: AC

## 2024-03-12 NOTE — Progress Notes (Signed)
 Subjective:    Patient ID: Cheryl Skinner, female    DOB: Nov 19, 1945, 78 y.o.   MRN: [id number]  When I last saw the patient, she was experiencing melena.  I had her discontinue her NSAID and her aspirin and started her on a proton pump inhibitor.  I referred her to GI but by the time she saw GI the that had stopped.  She has not seen any further melena.  She continues to refrain from using an NSAID and she is still on a proton pump inhibitor.  Unfortunately her blood pressure at home was averaging between 140 and 166 with.  She was on losartan  hydrochlorothiazide .  She stopped the medication due to concern over possible hypotension when she was experiencing a GI bleed.  However it appears that she needs to resume patient. Past Medical History:  Diagnosis Date   Hyperlipidemia    Hypertension    Osteopenia    T -2.4 in hip 2021   TMJ (temporomandibular joint syndrome)    Past Surgical History:  Procedure Laterality Date   ABDOMINAL HYSTERECTOMY     OVARIAN CYST REMOVAL     PILONIDAL CYST EXCISION     Current Outpatient Medications on File Prior to Visit  Medication Sig Dispense Refill   atorvastatin  (LIPITOR) 40 MG tablet Take 1 tablet by mouth once daily 90 tablet 0   meclizine  (ANTIVERT ) 25 MG tablet Take 1 tablet (25 mg total) by mouth 3 (three) times daily as needed for dizziness. 30 tablet 1   pantoprazole  (PROTONIX ) 40 MG tablet Take 1 tablet (40 mg total) by mouth 2 (two) times daily. 60 tablet 3   No current facility-administered medications on file prior to visit.   Allergies  Allergen Reactions   Codeine     REACTION: loses memory   Oxycodone Hcl     REACTION: loses memory   Morphine Hives   Ampicillin     REACTION: makes sick   Hydrocodone     REACTION: makes sick   Minocycline Hcl     REACTION: makes sick   Nitrofurantoin     REACTION: makes sick   Propoxyphene Hcl     REACTION: makes sick   Propoxyphene N-Acetaminophen     REACTION: makes sick   Social  History   Socioeconomic History   Marital status: Married    Spouse name: Not on file   Number of children: Not on file   Years of education: Not on file   Highest education level: Not on file  Occupational History   Not on file  Tobacco Use   Smoking status: Former    Types: Cigarettes   Smokeless tobacco: Never  Vaping Use   Vaping status: Never Used  Substance and Sexual Activity   Alcohol use: Yes    Comment: Rare   Drug use: No   Sexual activity: Not on file  Other Topics Concern   Not on file  Social History Narrative   Not on file   Social Drivers of Health   Financial Resource Strain: Not on file  Food Insecurity: Not on file  Transportation Needs: Not on file  Physical Activity: Not on file  Stress: Not on file  Social Connections: Not on file  Intimate Partner Violence: Not on file     Review of Systems  Skin:  Positive for rash.  All other systems reviewed and are negative.      Objective:   Physical Exam Constitutional:  General: She is not in acute distress.    Appearance: Normal appearance. She is normal weight. She is not ill-appearing or toxic-appearing.  HENT:     Head:   Cardiovascular:     Rate and Rhythm: Normal rate and regular rhythm.     Pulses: Normal pulses.     Heart sounds: Normal heart sounds. No murmur heard.    No gallop.  Pulmonary:     Effort: Pulmonary effort is normal. No respiratory distress.     Breath sounds: Normal breath sounds. No stridor. No wheezing, rhonchi or rales.  Abdominal:     General: Abdomen is flat. Bowel sounds are normal. There is no distension.     Palpations: Abdomen is soft.     Tenderness: There is no abdominal tenderness. There is no guarding.  Musculoskeletal:     Right lower leg: No edema.     Left lower leg: No edema.  Feet:     Right foot:     Skin integrity: Skin integrity normal.     Left foot:     Skin integrity: Skin integrity normal.  Neurological:     Mental Status: She is  alert.           Assessment & Plan:  Pure hypercholesterolemia - Plan: CBC with Differential/Platelet, Comprehensive metabolic panel with GFR, Lipid panel  Essential hypertension - Plan: CBC with Differential/Platelet, Comprehensive metabolic panel with GFR, Lipid panel Resume hydrochlorothiazide  25 mg a day and recheck blood pressure in 2 weeks.  Check CBC CMP and lipid panel

## 2024-03-13 LAB — COMPREHENSIVE METABOLIC PANEL WITH GFR
AG Ratio: 1.9 (calc) (ref 1.0–2.5)
ALT: 16 U/L (ref 6–29)
AST: 18 U/L (ref 10–35)
Albumin: 4.1 g/dL (ref 3.6–5.1)
Alkaline phosphatase (APISO): 53 U/L (ref 37–153)
BUN: 12 mg/dL (ref 7–25)
CO2: 28 mmol/L (ref 20–32)
Calcium: 9.6 mg/dL (ref 8.6–10.4)
Chloride: 108 mmol/L (ref 98–110)
Creat: 0.7 mg/dL (ref 0.60–1.00)
Globulin: 2.2 g/dL (ref 1.9–3.7)
Glucose, Bld: 91 mg/dL (ref 65–99)
Potassium: 4.1 mmol/L (ref 3.5–5.3)
Sodium: 143 mmol/L (ref 135–146)
Total Bilirubin: 0.6 mg/dL (ref 0.2–1.2)
Total Protein: 6.3 g/dL (ref 6.1–8.1)
eGFR: 88 mL/min/1.73m2 (ref >=60)

## 2024-03-13 LAB — LIPID PANEL
Cholesterol: 147 mg/dL (ref <200)
HDL: 53 mg/dL (ref >=50)
LDL Cholesterol (Calc): 72 mg/dL
Non-HDL Cholesterol (Calc): 94 mg/dL (ref <130)
Total CHOL/HDL Ratio: 2.8 (calc) (ref <5.0)
Triglycerides: 136 mg/dL (ref <150)

## 2024-03-13 LAB — CBC WITH DIFFERENTIAL/PLATELET
Absolute Lymphocytes: 1795 {cells}/uL (ref 850–3900)
Absolute Monocytes: 542 {cells}/uL (ref 200–950)
Basophils Absolute: 53 {cells}/uL (ref 0–200)
Basophils Relative: 1.1 %
Eosinophils Absolute: 202 {cells}/uL (ref 15–500)
Eosinophils Relative: 4.2 %
HCT: 35.4 % (ref 35.0–45.0)
Hemoglobin: 11.5 g/dL — ABNORMAL LOW (ref 11.7–15.5)
MCH: 29 pg (ref 27.0–33.0)
MCHC: 32.5 g/dL (ref 32.0–36.0)
MCV: 89.2 fL (ref 80.0–100.0)
MPV: 12.2 fL (ref 7.5–12.5)
Monocytes Relative: 11.3 %
Neutro Abs: 2208 {cells}/uL (ref 1500–7800)
Neutrophils Relative %: 46 %
Platelets: 232 Thousand/uL (ref 140–400)
RBC: 3.97 Million/uL (ref 3.80–5.10)
RDW: 12.7 % (ref 11.0–15.0)
Total Lymphocyte: 37.4 %
WBC: 4.8 Thousand/uL (ref 3.8–10.8)

## 2024-03-15 ENCOUNTER — Ambulatory Visit: Payer: Self-pay | Admitting: Family Medicine

## 2024-04-12 ENCOUNTER — Telehealth: Payer: Self-pay

## 2024-04-12 NOTE — Telephone Encounter (Signed)
 Copied from CRM [id number]. Topic: Clinical - Medication Question >> Apr 12, 2024  3:49 PM Cheryl Skinner wrote: Reason for CRM: Pt is asking to speak nurse about diclofenac  30mg . Pt looking for a replacement that wont cause stomach bleeding

## 2024-04-19 ENCOUNTER — Other Ambulatory Visit: Payer: Self-pay | Admitting: Family Medicine

## 2024-04-19 DIAGNOSIS — R42 Dizziness and giddiness: Secondary | ICD-10-CM

## 2024-04-20 NOTE — Telephone Encounter (Signed)
 Requested medication (s) are due for refill today: yes  Requested medication (s) are on the active medication list: yes  Last refill:  03/20/24  Future visit scheduled: no  Notes to clinic:  Unable to refill per protocol, cannot delegate.      Requested Prescriptions  Pending Prescriptions Disp Refills   meclizine  (ANTIVERT ) 25 MG tablet [Pharmacy Med Name: Meclizine  HCl 25 MG Oral Tablet] 30 tablet 0    Sig: TAKE 1 TABLET BY MOUTH THREE TIMES DAILY AS NEEDED FOR DIZZINESS     Not Delegated - Gastroenterology: Antiemetics Failed - 04/20/2024  3:21 PM      Failed - This refill cannot be delegated      Passed - Valid encounter within last 6 months    Recent Outpatient Visits           1 month ago Pure hypercholesterolemia   [place] [facility] Pickard, Butler DASEN, MD   2 months ago Melena   [place] [facility] Duanne Butler DASEN, MD   3 months ago Essential hypertension   [place] [facility] Duanne Butler DASEN, MD   6 months ago Herpes zoster without complication   [place] [facility] Duanne, Butler DASEN, MD   7 months ago Bronchitis   [place] [facility] Aletha Bene, MD

## 2024-04-26 ENCOUNTER — Ambulatory Visit: Payer: Self-pay

## 2024-04-26 NOTE — Telephone Encounter (Signed)
 Copied from CRM #[id number]. Topic: Clinical - Medical Advice >> Apr 26, 2024 12:39 PM Cheryl Skinner wrote: Reason for CRM: Patient is calling in and asking if she can come in a take a urine test because she thinks she may have a UTI. Did inform that patient she may have to have an appointment.   Call back: [phone]

## 2024-04-27 ENCOUNTER — Ambulatory Visit: Payer: Self-pay

## 2024-04-27 NOTE — Telephone Encounter (Signed)
 FYI Only or Action Required?: FYI only for provider: appointment scheduled on 04/28/2024.  Patient was last seen in primary care on 03/12/2024 by Duanne Butler DASEN, MD.  Called Nurse Triage reporting Groin Burn.  Symptoms began a week ago.  Interventions attempted: OTC medications: Azo.  Symptoms are: gradually worsening.  Triage Disposition: See PCP Within 2 Weeks  Patient/caregiver understands and will follow disposition?: Yes  Copied from CRM #[id number]. Topic: Clinical - Medical Advice >> Apr 26, 2024 12:39 PM Kevelyn M wrote: Reason for CRM: Patient is calling in and asking if she can come in a take a urine test because she thinks she may have a UTI. Did inform that patient she may have to have an appointment.   Call back: [phone] >> Apr 27, 2024 11:47 AM Zebedee SAUNDERS wrote: Pt returning Ulla Aquas, RN call, warm transferred to NT.  Reason for Disposition  All other vaginal symptoms  (Exceptions: Feels like prior yeast infection, minor abrasion, mild rash < 24 hour duration, mild itching, vaginal dryness during sex.)  Answer Assessment - Initial Assessment Questions Has been taking azo since Sunday night, when pain started. States she has been having this pain on and off for about a month. 1. SYMPTOM: What's the main symptom you're concerned about? (e.g., pain, itching, dryness)     Genital pain and tingling 2. LOCATION: Where is the  pain located? (e.g., inside/outside, left/right)     Genital area 3. ONSET: When did the  pain  start?     Sunday  4. PAIN: Is there any pain? If Yes, ask: How bad is it? (Scale: 1-10; mild, moderate, severe)     Just a little pain this morning, 1 5. ITCHING: Is there any itching? If Yes, ask: How bad is it? (Scale: 1-10; mild, moderate, severe)     Denies 7. OTHER SYMPTOMS: Do you have any other symptoms? (e.g., fever, itching, vaginal bleeding, pain with urination, injury to genital area, vaginal foreign body)     Denies.  Denies pain with urination,urgency, frequency.  Protocols used: Vaginal Symptoms-A-AH

## 2024-04-28 ENCOUNTER — Encounter: Payer: Self-pay | Admitting: Family Medicine

## 2024-04-28 ENCOUNTER — Ambulatory Visit: Admitting: Family Medicine

## 2024-04-28 VITALS — BP 135/89 | HR 104 | Temp 98.0°F | Ht 60.0 in | Wt 135.4 lb

## 2024-04-28 DIAGNOSIS — R399 Unspecified symptoms and signs involving the genitourinary system: Secondary | ICD-10-CM

## 2024-04-28 DIAGNOSIS — N3000 Acute cystitis without hematuria: Secondary | ICD-10-CM | POA: Insufficient documentation

## 2024-04-28 LAB — URINALYSIS, ROUTINE W REFLEX MICROSCOPIC
Hyaline Cast: NONE SEEN /LPF
Leukocytes,Ua: NEGATIVE
Nitrite: POSITIVE — AB
Specific Gravity, Urine: 1.015 (ref 1.001–1.035)
pH: 5.5 (ref 5.0–8.0)

## 2024-04-28 LAB — MICROSCOPIC MESSAGE

## 2024-04-28 MED ORDER — SULFAMETHOXAZOLE-TRIMETHOPRIM 800-160 MG PO TABS: 1.0000 | ORAL_TABLET | Freq: Two times a day (BID) | ORAL | 0 refills | Status: DC

## 2024-04-28 NOTE — Progress Notes (Signed)
 Acute Office Visit  Patient ID: Cheryl Skinner, female    DOB: December 03, 1945, 78 y.o.   MRN: [id number]  PCP: Duanne Lowers DASEN, MD  Chief Complaint  Patient presents with   Acute Visit    Starting Sunday: Genital/ low abdomen pressure, and tingling, light back pain , odor started taking otc AZO and has help but after two days symptoms returned     Subjective:     HPI  Discussed the use of AI scribe software for clinical note transcription with the patient, who gave verbal consent to proceed.  History of Present Illness Cheryl Skinner is an 78 year old female who presents with symptoms suggestive of a urinary tract infection.  She has been experiencing abdominal pressure, tingling, and back pain since Sunday, along with an unusual odor. No burning sensation during urination or vaginal discharge is noted. Denies fever, chills, body aches, hematuria.  The tingling sensation in the genital area has occurred four or five times over the past month, with each episode resolving on its own. On Sunday, while putting up her Christmas tree with her daughter, the symptoms returned, prompting her to seek relief with over-the-counter Azo, which alleviated the tingling and pressure.  She took Azo for two days, with the last dose taken the previous night. Today, she notes the return of symptoms, indicating that the relief was temporary. No blood in her urine, fevers, chills, or body aches are reported.  She mentions waking up with significant pain, which she attributes to her mattress, and confirms that her kidney function was reported as good during her last blood work.   Review of Systems  All other systems reviewed and are negative.      Objective:    BP 135/89   Pulse (!) 104   Temp 98 F (36.7 C)   Ht 5' (1.524 m)   Wt 135 lb 6.4 oz (61.4 kg)   SpO2 96%   BMI 26.44 kg/m    Physical Exam Vitals and nursing note reviewed.  Constitutional:      Appearance: Normal appearance. She is  normal weight.  HENT:     Head: Normocephalic and atraumatic.  Pulmonary:     Breath sounds: Normal breath sounds.  Skin:    General: Skin is warm and dry.  Neurological:     General: No focal deficit present.     Mental Status: She is alert and oriented to person, place, and time. Mental status is at baseline.  Psychiatric:        Mood and Affect: Mood normal.        Behavior: Behavior normal.        Thought Content: Thought content normal.        Judgment: Judgment normal.       Results for orders placed or performed in visit on 04/28/24  Urinalysis, Routine w reflex microscopic  Result Value Ref Range   Color, Urine ORANGE (A) YELLOW   APPearance CLEAR CLEAR   Specific Gravity, Urine 1.015 1.001 - 1.035   pH 5.5 5.0 - 8.0   Glucose, UA TRACE (A) NEGATIVE   Bilirubin Urine 1+ (A) NEGATIVE   Ketones, ur TRACE (A) NEGATIVE   Hgb urine dipstick 1+ (A) NEGATIVE   Protein, ur 2+ (A) NEGATIVE   Nitrite POSITIVE (A) NEGATIVE   Leukocytes,Ua NEGATIVE NEGATIVE   WBC, UA 10-20 (A) 0 - 5 /HPF   RBC / HPF 3-10 (A) 0 - 2 /HPF   Squamous  Epithelial / HPF 0-5 < OR = 5 /HPF   Bacteria, UA MODERATE (A) NONE SEEN /HPF   Hyaline Cast NONE SEEN NONE SEEN /LPF   Urine-Other    Microscopic Message  Result Value Ref Range   Note         Assessment & Plan:   Problem List Items Addressed This Visit     Acute cystitis without hematuria - Primary   Other Visit Diagnoses       UTI symptoms       Relevant Orders   Urinalysis, Routine w reflex microscopic (Completed)       Assessment and Plan Assessment & Plan Acute cystitis Positive urine analysis for bacteria and WBCs. Symptoms improved with Azo but recurred. - Prescribed antibiotics for 5 days. - Advised increased fluid intake. - Recommended Tylenol or ibuprofen  for symptom relief. - Continue Azo for symptom relief as needed. - Instructed to return if symptoms worsen or do not improve.    Meds ordered this encounter   Medications   sulfamethoxazole -trimethoprim  (BACTRIM  DS) 800-160 MG tablet    Sig: Take 1 tablet by mouth 2 (two) times daily.    Dispense:  14 tablet    Refill:  0    Supervising Provider:   DUANNE LOWERS T [[id number]]    No follow-ups on file.  Jeoffrey GORMAN Barrio, FNP [facility]

## 2024-04-29 ENCOUNTER — Ambulatory Visit: Payer: Self-pay | Admitting: Family Medicine

## 2024-04-29 MED ORDER — CEPHALEXIN 500 MG PO CAPS: 500.0000 mg | ORAL_CAPSULE | Freq: Three times a day (TID) | ORAL | 0 refills | Status: DC

## 2024-04-30 ENCOUNTER — Other Ambulatory Visit: Payer: Self-pay | Admitting: Family Medicine

## 2024-04-30 DIAGNOSIS — I10 Essential (primary) hypertension: Secondary | ICD-10-CM

## 2024-05-05 DIAGNOSIS — R5383 Other fatigue: Secondary | ICD-10-CM | POA: Insufficient documentation

## 2024-05-05 DIAGNOSIS — K921 Melena: Secondary | ICD-10-CM | POA: Insufficient documentation

## 2024-05-15 ENCOUNTER — Other Ambulatory Visit: Payer: Self-pay | Admitting: Family Medicine

## 2024-05-15 DIAGNOSIS — I10 Essential (primary) hypertension: Secondary | ICD-10-CM

## 2024-05-16 NOTE — Progress Notes (Signed)
 Pharmacy Quality Measure Review  This patient is appearing on a report for being at risk of failing the adherence measure for hypertension (ACEi/ARB) medications this calendar year.   Medication: losartan -hydrochlorothiazide  50-12.5 mg Last fill date: 01/15/2024 for 90 day supply  Per chart review, the medication was discontinued by the primary care provider due to concern for hypotension. No further action needed at this time.   Woodie Jock, PharmD PGY1 Pharmacy Resident  05/16/2024

## 2024-05-18 ENCOUNTER — Other Ambulatory Visit: Payer: Self-pay | Admitting: Family Medicine

## 2024-05-18 DIAGNOSIS — E7800 Pure hypercholesterolemia, unspecified: Secondary | ICD-10-CM

## 2024-05-21 ENCOUNTER — Telehealth: Payer: Self-pay

## 2024-05-21 NOTE — Telephone Encounter (Signed)
 Copied from CRM #[id number]. Topic: Clinical - Request for Lab/Test Order >> May 21, 2024 11:31 AM [name] wrote: Reason for CRM: Patient believes she has another UTI, wants to come in and do a urine sample. Just seen for UTI on 12/3 - please reach out and advise, no orders in chart & no appt till 12/29, patient declined scheduling as she wants to come in today. # [phone]

## 2024-05-21 NOTE — Telephone Encounter (Signed)
 Copied from CRM [id number]. Topic: Clinical - Prescription Issue >> May 19, 2024 12:13 PM Delon DASEN wrote: Reason for CRM: atorvastatin  (LIPITOR) 40 MG tablet- Sandy with [facility] states that the refill was not received by [facility] pharmacy, advised it was sent yesterday afternoon  MA contacted [facility] on [address] confirmed that medication was on hold due to pt. Requesting medication to early. Medication is ready and in process of filling now. Contacted pt. To inform medication will be ready later today. No answer. Left info on vm.

## 2024-05-21 NOTE — Telephone Encounter (Signed)
Pt informed

## 2024-05-24 ENCOUNTER — Ambulatory Visit: Payer: Self-pay

## 2024-05-24 NOTE — Telephone Encounter (Signed)
 FYI Only or Action Required?: Action required by provider: request for appointment.  Patient was last seen in primary care on 04/28/2024 by Kayla Jeoffrey RAMAN, FNP.  Called Nurse Triage reporting Dysuria.  Symptoms began several weeks ago.  Interventions attempted: Prescription medications: 2 rounds of abx.  Symptoms are: unchanged.  Triage Disposition: See Physician Within 24 Hours  Patient/caregiver understands and will follow disposition?: Yes, will follow disposition  Copied from CRM #[id number]. Topic: Clinical - Red Word Triage >> May 24, 2024  2:02 PM Aisha D wrote: Red Word that prompted transfer to Nurse Triage: burning sensation, pain  Pt stated that she is experiencing burning, pain and thinks she may have a UTI. Pt stated that she is also experiencing a cough and wants to get an appt scheduled with her provider.    ----------------------------------------------------------------------- From previous Reason for Contact - Scheduling: Patient/patient representative is calling to schedule an appointment. Refer to attachments for appointment information. Reason for Disposition  Age > 50 years  Answer Assessment - Initial Assessment Questions Pt states she was recently treated for a UTI. Pt states that she is not having pain/burning with urination, states she is having burning and pain. Pt unable to clarify what she means. Pt has been using Azo as well. States that the abx did not help with the pain/burning. Pt states that she would like to know if she is still having a UTI. Pt states she is going to get her 3rd abx. Pt denies s/s at this time. States that she doesn't feel normal down there.  Pt requesting to only see her PCP, routing to clinic for scheduling as no appts available with PCP until 1/6.  Protocols used: Urination Pain - Female-A-AH

## 2024-05-24 NOTE — Telephone Encounter (Signed)
 Spoke with pt. She states that she went to the UC on Friday due to UTI sx. She states that she was put on an abx and a urine culture was done. She would like to see PCP to discuss recurrent UTI's and other concerns. I scheduled her for Friday. I advised her that if she has any worsening sx, please go to the UC.

## 2024-05-28 ENCOUNTER — Encounter: Payer: Self-pay | Admitting: Family Medicine

## 2024-05-28 ENCOUNTER — Ambulatory Visit: Admitting: Family Medicine

## 2024-05-28 VITALS — BP 120/72 | HR 87 | Temp 98.1°F | Ht 60.0 in | Wt 133.2 lb

## 2024-05-28 DIAGNOSIS — E611 Iron deficiency: Secondary | ICD-10-CM | POA: Diagnosis not present

## 2024-05-28 DIAGNOSIS — R399 Unspecified symptoms and signs involving the genitourinary system: Secondary | ICD-10-CM

## 2024-05-28 LAB — CBC WITH DIFFERENTIAL/PLATELET
Absolute Lymphocytes: 1711 {cells}/uL (ref 850–3900)
Absolute Monocytes: 551 {cells}/uL (ref 200–950)
Basophils Absolute: 42 {cells}/uL (ref 0–200)
Basophils Relative: 0.8 %
Eosinophils Absolute: 239 {cells}/uL (ref 15–500)
Eosinophils Relative: 4.6 %
HCT: 40.6 % (ref 35.9–46.0)
Hemoglobin: 13.1 g/dL (ref 11.7–15.5)
MCH: 27.9 pg (ref 27.0–33.0)
MCHC: 32.3 g/dL (ref 31.6–35.4)
MCV: 86.6 fL (ref 81.4–101.7)
MPV: 11.9 fL (ref 7.5–12.5)
Monocytes Relative: 10.6 %
Neutro Abs: 2657 {cells}/uL (ref 1500–7800)
Neutrophils Relative %: 51.1 %
Platelets: 231 Thousand/uL (ref 140–400)
RBC: 4.69 Million/uL (ref 3.80–5.10)
RDW: 13.6 % (ref 11.0–15.0)
Total Lymphocyte: 32.9 %
WBC: 5.2 Thousand/uL (ref 3.8–10.8)

## 2024-05-28 LAB — MICROSCOPIC MESSAGE

## 2024-05-28 LAB — URINALYSIS, ROUTINE W REFLEX MICROSCOPIC
Bacteria, UA: NONE SEEN /HPF
Bilirubin Urine: NEGATIVE
Glucose, UA: NEGATIVE
Hyaline Cast: NONE SEEN /LPF
Ketones, ur: NEGATIVE
Leukocytes,Ua: NEGATIVE
Nitrite: NEGATIVE
Protein, ur: NEGATIVE
Specific Gravity, Urine: 1.02 (ref 1.001–1.035)
pH: 5.5 (ref 5.0–8.0)

## 2024-05-28 LAB — IRON: Iron: 62 ug/dL (ref 45–160)

## 2024-05-28 NOTE — Progress Notes (Signed)
 "  Subjective:    Patient ID: Cheryl Skinner, female    DOB: 29-Oct-1945, 79 y.o.   MRN: [id number]   Patient has a little bit of a confusing history.  From what I can piece together, she saw my partner for symptoms of a UTI approximately 1 month ago.  Urinalysis was markedly abnormal however the patient was on Azo at the time.  She was given Bactrim .  Symptoms improved and went away completely.  However a few weeks later she developed discomfort externally in the vagina around the urethra.  She started taking Azo however symptoms did not improve with Azo.  Therefore the patient started some antibiotic she had at home.  Symptoms did not improve.  At that point she went to an urgent care on January 26.  At that point she was taking Azo as well.  Urinalysis at the urgent care was abnormal but the patient was on Azo.  She continued to complain of PERI urethral discomfort.  She was given Bactrim  at the urgent care.  Today she states that her symptoms have completely subsided however she stopped antibiotics 2 days ago.  She denies any dysuria or urgency or frequency.  She denies any periurethral discomfort.  She denies any rash or lesions in the area.  Urinalysis is significant only for trace blood.  The patient is still on iron for her history of iron deficiency anemia Past Medical History:  Diagnosis Date   Hyperlipidemia    Hypertension    Osteopenia    T -2.4 in hip 2021   TMJ (temporomandibular joint syndrome)    Past Surgical History:  Procedure Laterality Date   ABDOMINAL HYSTERECTOMY     OVARIAN CYST REMOVAL     PILONIDAL CYST EXCISION     Current Outpatient Medications on File Prior to Visit  Medication Sig Dispense Refill   atorvastatin  (LIPITOR) 40 MG tablet Take 1 tablet by mouth once daily 90 tablet 0   cephALEXin  (KEFLEX ) 500 MG capsule Take 1 capsule (500 mg total) by mouth 3 (three) times daily. 21 capsule 0   ferrous sulfate 325 (65 FE) MG EC tablet Take 325 mg by mouth daily with  breakfast.     hydrochlorothiazide  (HYDRODIURIL ) 25 MG tablet Take 1 tablet (25 mg total) by mouth daily. 90 tablet 3   meclizine  (ANTIVERT ) 25 MG tablet TAKE 1 TABLET BY MOUTH THREE TIMES DAILY AS NEEDED FOR DIZZINESS 30 tablet 0   pantoprazole  (PROTONIX ) 40 MG tablet Take 1 tablet (40 mg total) by mouth 2 (two) times daily. 60 tablet 3   sulfamethoxazole -trimethoprim  (BACTRIM  DS) 800-160 MG tablet Take 1 tablet by mouth 2 (two) times daily. 14 tablet 0   triamcinolone  (KENALOG ) 0.025 % cream Apply 1 Application topically as needed.     No current facility-administered medications on file prior to visit.   Allergies  Allergen Reactions   Codeine     REACTION: loses memory   Oxycodone Hcl     REACTION: loses memory   Morphine Hives   Ampicillin     REACTION: makes sick   Hydrocodone     REACTION: makes sick   Minocycline Hcl     REACTION: makes sick   Nitrofurantoin     REACTION: makes sick   Propoxyphene Hcl     REACTION: makes sick   Propoxyphene N-Acetaminophen     REACTION: makes sick   Social History   Socioeconomic History   Marital status: Married    Spouse name:  Not on file   Number of children: Not on file   Years of education: Not on file   Highest education level: Not on file  Occupational History   Not on file  Tobacco Use   Smoking status: Former    Types: Cigarettes   Smokeless tobacco: Never  Vaping Use   Vaping status: Never Used  Substance and Sexual Activity   Alcohol use: Yes    Comment: Rare   Drug use: No   Sexual activity: Not on file  Other Topics Concern   Not on file  Social History Narrative   Not on file   Social Drivers of Health   Tobacco Use: Medium Risk (05/28/2024)   Patient History    Smoking Tobacco Use: Former    Smokeless Tobacco Use: Never    Passive Exposure: Not on Actuary Strain: Not on file  Food Insecurity: Not on file  Transportation Needs: Not on file  Physical Activity: Not on file  Stress:  Not on file  Social Connections: Not on file  Intimate Partner Violence: Not on file  Depression (PHQ2-9): Low Risk (01/22/2024)   Depression (PHQ2-9)    PHQ-2 Score: 0  Alcohol Screen: Not on file  Housing: Not on file  Utilities: Not on file  Health Literacy: Not on file     Review of Systems  Skin:  Positive for rash.  All other systems reviewed and are negative.      Objective:   Physical Exam Constitutional:      General: She is not in acute distress.    Appearance: Normal appearance. She is normal weight. She is not ill-appearing or toxic-appearing.  Cardiovascular:     Rate and Rhythm: Normal rate and regular rhythm.     Pulses: Normal pulses.     Heart sounds: Normal heart sounds. No murmur heard.    No gallop.  Pulmonary:     Effort: Pulmonary effort is normal. No respiratory distress.     Breath sounds: Normal breath sounds. No stridor. No wheezing, rhonchi or rales.  Abdominal:     General: Abdomen is flat. Bowel sounds are normal. There is no distension.     Palpations: Abdomen is soft.     Tenderness: There is no abdominal tenderness. There is no guarding.  Musculoskeletal:     Right lower leg: No edema.     Left lower leg: No edema.  Feet:     Right foot:     Skin integrity: Skin integrity normal.     Left foot:     Skin integrity: Skin integrity normal.  Neurological:     Mental Status: She is alert.           Assessment & Plan:  UTI symptoms - Plan: Urinalysis, Routine w reflex microscopic  Iron deficiency - Plan: CBC with Differential/Platelet, Iron First regarding her iron deficiency, I will check a CBC and an iron level.  If they are both normal, the patient can discontinue her iron supplement.  She is completely asymptomatic at the present time and she has 48 hours removed from her antibiotics.  She declines pelvic exam today.  I explained to the patient that if she develops dysuria urgency or frequency again, I would want to get a urine  culture to confirm UTI as this has not been confirmed.  I would also want to do a pelvic exam to evaluate for any genital lesions that could be malignancy or even vaginitis.  If  there is no explanation I would recommend urology consult with cystoscopy.  Patient may benefit from topical estrogen for atrophic vaginitis if there is no direct cause to determined.  She will call me back if symptoms recur "

## 2024-05-31 ENCOUNTER — Ambulatory Visit: Admitting: Family Medicine

## 2024-05-31 ENCOUNTER — Ambulatory Visit: Payer: Self-pay | Admitting: Family Medicine

## 2024-05-31 ENCOUNTER — Encounter: Payer: Self-pay | Admitting: Family Medicine

## 2024-05-31 VITALS — BP 136/78 | HR 75 | Temp 97.8°F | Ht 60.0 in | Wt 131.4 lb

## 2024-05-31 DIAGNOSIS — R052 Subacute cough: Secondary | ICD-10-CM | POA: Diagnosis not present

## 2024-05-31 DIAGNOSIS — J0110 Acute frontal sinusitis, unspecified: Secondary | ICD-10-CM | POA: Diagnosis not present

## 2024-05-31 LAB — INFLUENZA A AND B AG, IMMUNOASSAY
INFLUENZA A ANTIGEN: NOT DETECTED
INFLUENZA B ANTIGEN: NOT DETECTED

## 2024-05-31 MED ORDER — LEVOFLOXACIN 500 MG PO TABS: 500.0000 mg | ORAL_TABLET | Freq: Every day | ORAL | 0 refills | Status: AC

## 2024-05-31 NOTE — Progress Notes (Signed)
 "  Acute Office Visit  Patient ID: Cheryl Skinner, female    DOB: [birth year], 79 y.o.   MRN: [id number]  PCP: Duanne Lowers DASEN, MD  Chief Complaint  Patient presents with   Acute Visit    Cough and congestion, soreness in chest from coughing, all since last UTI around christmas      Subjective:     HPI  Discussed the use of AI scribe software for clinical note transcription with the patient, who gave verbal consent to proceed.  History of Present Illness Cheryl Skinner is an 79 year old female who presents with persistent cough and congestion.  She has been experiencing a persistent cough and congestion that began on Christmas Eve, approximately two weeks ago. The cough is productive of clear sputum, and she has nasal congestion with clear and mucopurulent nasal discharge. She has had intermittent fevers, with the last recorded fever being 99.73F a week ago Tuesday. No ear pain, but she reports previous pain on the right side of her face, including her eyeball. No shortness of breath or wheezing.  She has a history of urinary tract infections and was treated with antibiotics, including Bactrim  and Keflex , after experiencing pain in the genital area. A culture taken after Christmas was clear, and she completed the antibiotics course last week. She is not currently on antibiotics for the UTI.  She started taking Mucinex  on Saturday to help with her respiratory symptoms and is staying hydrated. Her medication history includes a course of Bactrim  and a course of Keflex . She is allergic to ampicillin, which causes nausea. She is not sure if she has tolerated penicillin or amoxicillin in the past. She has not had recent exposure to amoxicillin.  She lives with her husband, who is disabled.   Review of Systems  All other systems reviewed and are negative.   Past Medical History:  Diagnosis Date   Hyperlipidemia    Hypertension    Osteopenia    T -2.4 in hip 2021   TMJ  (temporomandibular joint syndrome)     Past Surgical History:  Procedure Laterality Date   ABDOMINAL HYSTERECTOMY     OVARIAN CYST REMOVAL     PILONIDAL CYST EXCISION      Outpatient Medications Prior to Visit  Medication Sig Dispense Refill   atorvastatin  (LIPITOR) 40 MG tablet Take 1 tablet by mouth once daily 90 tablet 0   ferrous sulfate 325 (65 FE) MG EC tablet Take 325 mg by mouth daily with breakfast.     hydrochlorothiazide  (HYDRODIURIL ) 25 MG tablet Take 1 tablet (25 mg total) by mouth daily. 90 tablet 3   meclizine  (ANTIVERT ) 25 MG tablet TAKE 1 TABLET BY MOUTH THREE TIMES DAILY AS NEEDED FOR DIZZINESS 30 tablet 0   pantoprazole  (PROTONIX ) 40 MG tablet Take 1 tablet (40 mg total) by mouth 2 (two) times daily. 60 tablet 3   triamcinolone  (KENALOG ) 0.025 % cream Apply 1 Application topically as needed.     cephALEXin  (KEFLEX ) 500 MG capsule Take 1 capsule (500 mg total) by mouth 3 (three) times daily. (Patient not taking: Reported on 05/31/2024) 21 capsule 0   sulfamethoxazole -trimethoprim  (BACTRIM  DS) 800-160 MG tablet Take 1 tablet by mouth 2 (two) times daily. (Patient not taking: Reported on 05/31/2024) 14 tablet 0   No facility-administered medications prior to visit.    Allergies[1]     Objective:    BP 136/78   Pulse 75   Temp 97.8 F (36.6 C)  Ht 5' (1.524 m)   Wt 131 lb 6.4 oz (59.6 kg)   SpO2 98%   BMI 25.66 kg/m    Physical Exam Vitals and nursing note reviewed.  Constitutional:      Appearance: Normal appearance. She is normal weight.  HENT:     Head: Normocephalic and atraumatic.     Right Ear: Tympanic membrane, ear canal and external ear normal.     Left Ear: Tympanic membrane, ear canal and external ear normal.     Nose: Congestion present.     Right Sinus: Maxillary sinus tenderness present. No frontal sinus tenderness.     Left Sinus: No maxillary sinus tenderness or frontal sinus tenderness.     Mouth/Throat:     Mouth: Mucous membranes are  moist.     Pharynx: Oropharynx is clear.  Eyes:     Conjunctiva/sclera: Conjunctivae normal.  Cardiovascular:     Rate and Rhythm: Normal rate and regular rhythm.     Pulses: Normal pulses.     Heart sounds: Normal heart sounds.  Pulmonary:     Effort: Pulmonary effort is normal.     Breath sounds: Normal breath sounds.  Musculoskeletal:     Cervical back: No tenderness.  Lymphadenopathy:     Cervical: No cervical adenopathy.  Skin:    General: Skin is warm and dry.  Neurological:     General: No focal deficit present.     Mental Status: She is alert and oriented to person, place, and time. Mental status is at baseline.  Psychiatric:        Mood and Affect: Mood normal.        Behavior: Behavior normal.        Thought Content: Thought content normal.        Judgment: Judgment normal.       No results found for any visits on 05/31/24.     Assessment & Plan:   Problem List Items Addressed This Visit       Respiratory   Subacute frontal sinusitis - Primary   Relevant Medications   levofloxacin  (LEVAQUIN ) 500 MG tablet   Other Visit Diagnoses       Subacute cough       Relevant Orders   Influenza A and B Ag, Immunoassay       Assessment and Plan Assessment & Plan Acute sinusitis Symptoms suggest sinusitis. Pneumonia unlikely. Limited antibiotic options due to allergies. - Prescribed levofloxacin  to be picked up at [facility]. - Flu swab negative.    Meds ordered this encounter  Medications   levofloxacin  (LEVAQUIN ) 500 MG tablet    Sig: Take 1 tablet (500 mg total) by mouth daily for 7 days.    Dispense:  7 tablet    Refill:  0    Supervising Provider:   DUANNE LOWERS T [[id number]]    Return if symptoms worsen or fail to improve.  Cheryl GORMAN Barrio, FNP [facility]      [1]  Allergies Allergen Reactions   Codeine     REACTION: loses memory   Oxycodone Hcl     REACTION: loses memory   Morphine Hives   Ampicillin      REACTION: makes sick   Hydrocodone     REACTION: makes sick   Minocycline Hcl     REACTION: makes sick   Nitrofurantoin     REACTION: makes sick   Propoxyphene Hcl     REACTION: makes sick   Propoxyphene  N-Acetaminophen     REACTION: makes sick   "

## 2024-06-01 ENCOUNTER — Ambulatory Visit: Payer: Self-pay | Admitting: Family Medicine

## 2024-06-16 ENCOUNTER — Other Ambulatory Visit: Payer: Self-pay | Admitting: Family Medicine

## 2024-06-16 ENCOUNTER — Telehealth: Payer: Self-pay

## 2024-06-16 DIAGNOSIS — E7800 Pure hypercholesterolemia, unspecified: Secondary | ICD-10-CM

## 2024-06-16 NOTE — Telephone Encounter (Signed)
 Rx 05/18/24 #90- too soon Requested Prescriptions  Pending Prescriptions Disp Refills   atorvastatin  (LIPITOR) 40 MG tablet [Pharmacy Med Name: Atorvastatin  Calcium  40 MG Oral Tablet] 90 tablet 0    Sig: Take 1 tablet by mouth once daily     Cardiovascular:  Antilipid - Statins Failed - 06/16/2024  3:03 PM      Failed - Lipid Panel in normal range within the last 12 months    Cholesterol  Date Value Ref Range Status  03/12/2024 147 <200 mg/dL Final   LDL Cholesterol (Calc)  Date Value Ref Range Status  03/12/2024 72 mg/dL (calc) Final    Comment:    Reference range: <100 . Desirable range <100 mg/dL for primary prevention;   <70 mg/dL for patients with CHD or diabetic patients  with > or = 2 CHD risk factors. SABRA LDL-C is now calculated using the Martin-Hopkins  calculation, which is a validated novel method providing  better accuracy than the Friedewald equation in the  estimation of LDL-C.  Gladis APPLETHWAITE et al. SANDREA. 7986;689(80): 2061-2068  (http://education.[facility].com/faq/FAQ164)    HDL  Date Value Ref Range Status  03/12/2024 53 > OR = 50 mg/dL Final   Triglycerides  Date Value Ref Range Status  03/12/2024 136 <150 mg/dL Final         Passed - Patient is not pregnant      Passed - Valid encounter within last 12 months    Recent Outpatient Visits           2 weeks ago Subacute frontal sinusitis   [place] [facility] Kayla Jeoffrey RAMAN, FNP   2 weeks ago UTI symptoms   [place] [facility] Duanne Butler DASEN, MD   1 month ago Acute cystitis without hematuria   [place] [facility] Kayla Jeoffrey RAMAN, FNP   3 months ago Pure hypercholesterolemia   [place] [facility] Pickard, Butler DASEN, MD   4 months ago Melena   [place] [facility] Pickard, Butler DASEN, MD

## 2024-06-16 NOTE — Telephone Encounter (Signed)
 Copied from CRM #[id number]. Topic: Appointments - Scheduling Inquiry for Clinic >> Jun 16, 2024 11:09 AM Selinda RAMAN wrote: Reason for CRM: The patient called asking for a nurse to call her back regarding her Friday 06/18/24 procedure appointment. She states she is not currently having any symptoms and is questioning if she still even needs the appointment. Please assist patient further.

## 2024-06-18 ENCOUNTER — Ambulatory Visit: Admitting: Family Medicine
